# Patient Record
Sex: Female | Born: 1970
Health system: Southern US, Community
[De-identification: ages and names within clinical notes are randomized; demographics above are authoritative.]

## PROBLEM LIST (undated history)

## (undated) DIAGNOSIS — I10 Essential (primary) hypertension: Secondary | ICD-10-CM

## (undated) DIAGNOSIS — E785 Hyperlipidemia, unspecified: Secondary | ICD-10-CM

## (undated) DIAGNOSIS — K219 Gastro-esophageal reflux disease without esophagitis: Secondary | ICD-10-CM

## (undated) HISTORY — DX: Gastro-esophageal reflux disease without esophagitis: K21.9

## (undated) HISTORY — PX: TONSILLECTOMY: SUR1361

## (undated) HISTORY — PX: WISDOM TOOTH EXTRACTION: SHX21

## (undated) HISTORY — DX: Hyperlipidemia, unspecified: E78.5

## (undated) HISTORY — DX: Essential (primary) hypertension: I10

---

## 2004-01-07 ENCOUNTER — Ambulatory Visit: Payer: Self-pay | Admitting: Obstetrics and Gynecology

## 2005-08-01 ENCOUNTER — Other Ambulatory Visit: Payer: Self-pay

## 2005-08-01 ENCOUNTER — Emergency Department: Payer: Self-pay | Admitting: Emergency Medicine

## 2005-08-03 ENCOUNTER — Ambulatory Visit: Payer: Self-pay | Admitting: Emergency Medicine

## 2005-08-11 ENCOUNTER — Ambulatory Visit: Payer: Self-pay | Admitting: Gastroenterology

## 2006-03-06 ENCOUNTER — Emergency Department: Payer: Self-pay | Admitting: Unknown Physician Specialty

## 2009-08-06 ENCOUNTER — Ambulatory Visit: Payer: Self-pay | Admitting: Obstetrics and Gynecology

## 2009-08-08 ENCOUNTER — Ambulatory Visit: Payer: Self-pay | Admitting: Obstetrics and Gynecology

## 2010-09-03 ENCOUNTER — Ambulatory Visit: Payer: Self-pay | Admitting: Internal Medicine

## 2010-09-04 ENCOUNTER — Ambulatory Visit: Payer: Self-pay | Admitting: Internal Medicine

## 2011-12-25 ENCOUNTER — Emergency Department: Payer: Self-pay | Admitting: Emergency Medicine

## 2011-12-25 LAB — COMPREHENSIVE METABOLIC PANEL
Albumin: 3.7 g/dL (ref 3.4–5.0)
Alkaline Phosphatase: 122 U/L (ref 50–136)
Anion Gap: 11 (ref 7–16)
BUN: 7 mg/dL (ref 7–18)
Calcium, Total: 9 mg/dL (ref 8.5–10.1)
Chloride: 102 mmol/L (ref 98–107)
Creatinine: 0.78 mg/dL (ref 0.60–1.30)
EGFR (African American): >60
Glucose: 102 mg/dL — ABNORMAL HIGH (ref 65–99)
Osmolality: 272 (ref 275–301)
Total Protein: 7.6 g/dL (ref 6.4–8.2)

## 2011-12-25 LAB — TROPONIN I: Troponin-I: <0.02 ng/mL

## 2011-12-25 LAB — CBC
HCT: 43 % (ref 35.0–47.0)
HGB: 15.1 g/dL (ref 12.0–16.0)
WBC: 12.2 10*3/uL — ABNORMAL HIGH (ref 3.6–11.0)

## 2016-01-12 ENCOUNTER — Ambulatory Visit: Payer: Self-pay | Admitting: Podiatry

## 2016-04-06 DIAGNOSIS — Z1231 Encounter for screening mammogram for malignant neoplasm of breast: Secondary | ICD-10-CM | POA: Diagnosis not present

## 2016-04-06 DIAGNOSIS — Z01419 Encounter for gynecological examination (general) (routine) without abnormal findings: Secondary | ICD-10-CM | POA: Diagnosis not present

## 2016-04-12 DIAGNOSIS — M8589 Other specified disorders of bone density and structure, multiple sites: Secondary | ICD-10-CM | POA: Diagnosis not present

## 2016-04-12 DIAGNOSIS — Z8739 Personal history of other diseases of the musculoskeletal system and connective tissue: Secondary | ICD-10-CM | POA: Diagnosis not present

## 2016-05-12 DIAGNOSIS — H5213 Myopia, bilateral: Secondary | ICD-10-CM | POA: Diagnosis not present

## 2016-05-19 DIAGNOSIS — J069 Acute upper respiratory infection, unspecified: Secondary | ICD-10-CM | POA: Diagnosis not present

## 2016-05-19 DIAGNOSIS — J029 Acute pharyngitis, unspecified: Secondary | ICD-10-CM | POA: Diagnosis not present

## 2016-06-14 DIAGNOSIS — E559 Vitamin D deficiency, unspecified: Secondary | ICD-10-CM | POA: Diagnosis not present

## 2016-06-14 DIAGNOSIS — M8580 Other specified disorders of bone density and structure, unspecified site: Secondary | ICD-10-CM | POA: Diagnosis not present

## 2016-09-21 DIAGNOSIS — J069 Acute upper respiratory infection, unspecified: Secondary | ICD-10-CM | POA: Diagnosis not present

## 2016-11-29 DIAGNOSIS — S60862A Insect bite (nonvenomous) of left wrist, initial encounter: Secondary | ICD-10-CM | POA: Diagnosis not present

## 2016-11-29 DIAGNOSIS — S80862A Insect bite (nonvenomous), left lower leg, initial encounter: Secondary | ICD-10-CM | POA: Diagnosis not present

## 2016-11-29 DIAGNOSIS — S90861A Insect bite (nonvenomous), right foot, initial encounter: Secondary | ICD-10-CM | POA: Diagnosis not present

## 2017-02-07 DIAGNOSIS — L821 Other seborrheic keratosis: Secondary | ICD-10-CM | POA: Diagnosis not present

## 2017-02-07 DIAGNOSIS — D229 Melanocytic nevi, unspecified: Secondary | ICD-10-CM | POA: Diagnosis not present

## 2017-02-07 DIAGNOSIS — Z1283 Encounter for screening for malignant neoplasm of skin: Secondary | ICD-10-CM | POA: Diagnosis not present

## 2017-04-06 DIAGNOSIS — Z01419 Encounter for gynecological examination (general) (routine) without abnormal findings: Secondary | ICD-10-CM | POA: Diagnosis not present

## 2017-04-06 DIAGNOSIS — K219 Gastro-esophageal reflux disease without esophagitis: Secondary | ICD-10-CM | POA: Diagnosis not present

## 2017-04-06 DIAGNOSIS — Z124 Encounter for screening for malignant neoplasm of cervix: Secondary | ICD-10-CM | POA: Diagnosis not present

## 2017-04-12 DIAGNOSIS — Z1231 Encounter for screening mammogram for malignant neoplasm of breast: Secondary | ICD-10-CM | POA: Diagnosis not present

## 2017-04-16 DIAGNOSIS — M25552 Pain in left hip: Secondary | ICD-10-CM | POA: Diagnosis not present

## 2017-04-16 DIAGNOSIS — R0789 Other chest pain: Secondary | ICD-10-CM | POA: Diagnosis not present

## 2017-07-01 DIAGNOSIS — J40 Bronchitis, not specified as acute or chronic: Secondary | ICD-10-CM | POA: Diagnosis not present

## 2017-10-21 DIAGNOSIS — R232 Flushing: Secondary | ICD-10-CM | POA: Diagnosis not present

## 2017-11-08 DIAGNOSIS — N951 Menopausal and female climacteric states: Secondary | ICD-10-CM | POA: Diagnosis not present

## 2017-11-08 DIAGNOSIS — F5102 Adjustment insomnia: Secondary | ICD-10-CM | POA: Diagnosis not present

## 2018-01-16 ENCOUNTER — Ambulatory Visit: Payer: Self-pay | Admitting: Internal Medicine

## 2018-01-19 ENCOUNTER — Encounter: Payer: Self-pay | Admitting: Internal Medicine

## 2018-01-19 ENCOUNTER — Encounter (INDEPENDENT_AMBULATORY_CARE_PROVIDER_SITE_OTHER): Payer: Self-pay

## 2018-01-19 ENCOUNTER — Ambulatory Visit (INDEPENDENT_AMBULATORY_CARE_PROVIDER_SITE_OTHER): Payer: 59 | Admitting: Internal Medicine

## 2018-01-19 VITALS — BP 118/72 | HR 60 | Temp 98.0°F | Ht 63.0 in | Wt 144.0 lb

## 2018-01-19 DIAGNOSIS — K219 Gastro-esophageal reflux disease without esophagitis: Secondary | ICD-10-CM | POA: Diagnosis not present

## 2018-01-19 DIAGNOSIS — F419 Anxiety disorder, unspecified: Secondary | ICD-10-CM

## 2018-01-19 DIAGNOSIS — F5104 Psychophysiologic insomnia: Secondary | ICD-10-CM | POA: Diagnosis not present

## 2018-01-19 DIAGNOSIS — I10 Essential (primary) hypertension: Secondary | ICD-10-CM

## 2018-01-19 DIAGNOSIS — E7800 Pure hypercholesterolemia, unspecified: Secondary | ICD-10-CM | POA: Insufficient documentation

## 2018-01-19 MED ORDER — HYDROXYZINE HCL 10 MG PO TABS: 10.0000 mg | ORAL_TABLET | Freq: Every day | ORAL | 0 refills | Status: DC | PRN

## 2018-01-19 MED ORDER — SERTRALINE HCL 50 MG PO TABS: 50.0000 mg | ORAL_TABLET | Freq: Every day | ORAL | 2 refills | Status: DC

## 2018-01-19 NOTE — Assessment & Plan Note (Signed)
Support offered today Will trial Sertraline and Hydroxyzine Advised her to update me in 4 weeks

## 2018-01-19 NOTE — Assessment & Plan Note (Addendum)
Controlled on Ranitidine Encouraged her to avoid foods that trigger her reflux

## 2018-01-19 NOTE — Assessment & Plan Note (Signed)
Continue Trazadone nightly

## 2018-01-19 NOTE — Assessment & Plan Note (Signed)
Controlled on Bisoprolol-HCT Reinforced DASH diet

## 2018-01-19 NOTE — Progress Notes (Signed)
HPI  Pt presents to the clinic today to establish care and for management of the conditions listed below. She has not had a PCP in many years .  GERD: She is not sure what triggers this, maybe spicy foods. She takes Ranitidine with good relief. She has never had an upper GI.  Insomnia: Secondary to perimenopause. She has trouble staying asleep. She takes Trazadone nightly with good relief.   HTN: Her BP today is 118/72. She is taking Bisoprolol-HCT as prescribed. There is no ECG on file.  HLD: She is not sure what her last LDL was or when it was checked. She is not taking any cholesterol lowering medication but does take Fish Oil OTC. She tries to consume a low fat diet.  She also reports anxiety. She has noticed this over the last 6 months, and it seems to be getting worse. She reports she has had a lot of stress lately with her daughter getting married, switching jobs, and caring for her elderly mother. She has never had issues with anxiety or depression in the past. She denies SI/HI.  Flu: never Tetanus: unsure Pap Smear: 04/2017 Mammogram: 04/2017 Vision Screening: annually Dentist: biannually  Past Medical History:  Diagnosis Date  . GERD (gastroesophageal reflux disease)   . Hyperlipidemia   . Hypertension     Current Outpatient Medications  Medication Sig Dispense Refill  . bisoprolol-hydrochlorothiazide (ZIAC) 5-6.25 MG tablet Take 1 tablet by mouth daily.  2  . Calcium Carb-Cholecalciferol (CALTRATE 600+D3) 600-800 MG-UNIT TABS Take by mouth.    . Omega-3 Fatty Acids (FISH OIL) 1000 MG CAPS Take by mouth.    . ranitidine (ZANTAC) 150 MG tablet Take 150 mg by mouth 2 (two) times daily.  2  . traZODone (DESYREL) 50 MG tablet TAKE 1 TABLET BY MOUTH EVERY DAY AT NIGHT  4   No current facility-administered medications for this visit.     Allergies  Allergen Reactions  . Codeine Nausea Only    Family History  Problem Relation Age of Onset  . Breast cancer Mother   .  Hypertension Mother   . Anxiety disorder Mother   . Heart disease Father     Social History   Socioeconomic History  . Marital status: Married    Spouse name: Not on file  . Number of children: Not on file  . Years of education: Not on file  . Highest education level: Not on file  Occupational History  . Not on file  Social Needs  . Financial resource strain: Not on file  . Food insecurity:    Worry: Not on file    Inability: Not on file  . Transportation needs:    Medical: Not on file    Non-medical: Not on file  Tobacco Use  . Smoking status: Never Smoker  . Smokeless tobacco: Never Used  Substance and Sexual Activity  . Alcohol use: Yes    Comment: occasional  . Drug use: Never  . Sexual activity: Not on file  Lifestyle  . Physical activity:    Days per week: Not on file    Minutes per session: Not on file  . Stress: Not on file  Relationships  . Social connections:    Talks on phone: Not on file    Gets together: Not on file    Attends religious service: Not on file    Active member of club or organization: Not on file    Attends meetings of clubs or organizations: Not  on file    Relationship status: Not on file  . Intimate partner violence:    Fear of current or ex partner: Not on file    Emotionally abused: Not on file    Physically abused: Not on file    Forced sexual activity: Not on file  Other Topics Concern  . Not on file  Social History Narrative  . Not on file    ROS:  Constitutional: Denies fever, malaise, fatigue, headache or abrupt weight changes.  HEENT: Denies eye pain, eye redness, ear pain, ringing in the ears, wax buildup, runny nose, nasal congestion, bloody nose, or sore throat. Respiratory: Denies difficulty breathing, shortness of breath, cough or sputum production.   Cardiovascular: Denies chest pain, chest tightness, palpitations or swelling in the hands or feet.  Gastrointestinal: Denies abdominal pain, bloating, constipation,  diarrhea or blood in the stool.  GU: Denies frequency, urgency, pain with urination, blood in urine, odor or discharge. Musculoskeletal: Denies decrease in range of motion, difficulty with gait, muscle pain or joint pain and swelling.  Skin: Denies redness, rashes, lesions or ulcercations.  Neurological: Pt reports insomnia. Denies dizziness, difficulty with memory, difficulty with speech or problems with balance and coordination.  Psych: Pt reports anxiety. Denies depression, SI/HI.  No other specific complaints in a complete review of systems (except as listed in HPI above).  PE:  BP 118/72   Pulse 60   Temp 98 F (36.7 C) (Oral)   Ht 5\' 3"  (1.6 m)   Wt 144 lb (65.3 kg)   SpO2 98%   BMI 25.51 kg/m   Wt Readings from Last 3 Encounters:  01/19/18 144 lb (65.3 kg)    General: Appears her stated age, well developed, well nourished in NAD. Cardiovascular: Normal rate and rhythm. S1,S2 noted.  No murmur, rubs or gallops noted. No JVD or BLE edema.  Pulmonary/Chest: Normal effort and positive vesicular breath sounds. No respiratory distress. No wheezes, rales or ronchi noted.  Abdomen: Soft and nontender. Normal bowel sounds. Neurological: Alert and oriented.  Psychiatric: She is mildly anxious appearing. Behavior is normal. Judgment and thought content normal.    BMET    Component Value Date/Time   NA 137 12/25/2011 2114   K 3.5 12/25/2011 2114   CL 102 12/25/2011 2114   CO2 24 12/25/2011 2114   GLUCOSE 102 (H) 12/25/2011 2114   BUN 7 12/25/2011 2114   CREATININE 0.78 12/25/2011 2114   CALCIUM 9.0 12/25/2011 2114   GFRNONAA >60 12/25/2011 2114   GFRAA >60 12/25/2011 2114    Lipid Panel  No results found for: CHOL, TRIG, HDL, CHOLHDL, VLDL, LDLCALC  CBC    Component Value Date/Time   WBC 12.2 (H) 12/25/2011 2114   RBC 4.87 12/25/2011 2114   HGB 15.1 12/25/2011 2114   HCT 43.0 12/25/2011 2114   PLT 207 12/25/2011 2114   MCV 88 12/25/2011 2114   MCH 30.9  12/25/2011 2114   MCHC 35.0 12/25/2011 2114   RDW 12.9 12/25/2011 2114    Hgb A1C No results found for: HGBA1C   Assessment and Plan:

## 2018-01-19 NOTE — Assessment & Plan Note (Signed)
Encouraged her to consume a low fat diet Continue Fish Oil OTC

## 2018-01-19 NOTE — Patient Instructions (Signed)

## 2018-01-22 DIAGNOSIS — S90561A Insect bite (nonvenomous), right ankle, initial encounter: Secondary | ICD-10-CM | POA: Diagnosis not present

## 2018-01-22 DIAGNOSIS — Z23 Encounter for immunization: Secondary | ICD-10-CM | POA: Diagnosis not present

## 2018-01-22 DIAGNOSIS — R238 Other skin changes: Secondary | ICD-10-CM | POA: Diagnosis not present

## 2018-02-11 ENCOUNTER — Other Ambulatory Visit: Payer: Self-pay | Admitting: Internal Medicine

## 2018-03-11 ENCOUNTER — Other Ambulatory Visit: Payer: Self-pay | Admitting: Internal Medicine

## 2018-03-14 MED ORDER — HYDROXYZINE HCL 10 MG PO TABS: 10.0000 mg | ORAL_TABLET | Freq: Every day | ORAL | 0 refills | Status: DC | PRN

## 2018-03-30 ENCOUNTER — Other Ambulatory Visit: Payer: Self-pay | Admitting: Internal Medicine

## 2018-03-31 MED ORDER — SERTRALINE HCL 50 MG PO TABS: 50.0000 mg | ORAL_TABLET | Freq: Every day | ORAL | 0 refills | Status: DC

## 2018-03-31 NOTE — Addendum Note (Signed)
Addended by: Lurlean Nanny on: 03/31/2018 03:29 PM   Modules accepted: Orders

## 2018-04-05 ENCOUNTER — Other Ambulatory Visit: Payer: Self-pay | Admitting: Internal Medicine

## 2018-04-19 DIAGNOSIS — Z124 Encounter for screening for malignant neoplasm of cervix: Secondary | ICD-10-CM | POA: Diagnosis not present

## 2018-04-19 DIAGNOSIS — N941 Unspecified dyspareunia: Secondary | ICD-10-CM | POA: Diagnosis not present

## 2018-04-19 DIAGNOSIS — Z01419 Encounter for gynecological examination (general) (routine) without abnormal findings: Secondary | ICD-10-CM | POA: Diagnosis not present

## 2018-04-19 DIAGNOSIS — Z1211 Encounter for screening for malignant neoplasm of colon: Secondary | ICD-10-CM | POA: Diagnosis not present

## 2018-04-20 DIAGNOSIS — Z01419 Encounter for gynecological examination (general) (routine) without abnormal findings: Secondary | ICD-10-CM | POA: Diagnosis not present

## 2018-04-28 DIAGNOSIS — Z1231 Encounter for screening mammogram for malignant neoplasm of breast: Secondary | ICD-10-CM | POA: Diagnosis not present

## 2018-05-09 ENCOUNTER — Other Ambulatory Visit: Payer: Self-pay | Admitting: Internal Medicine

## 2018-05-10 DIAGNOSIS — R87612 Low grade squamous intraepithelial lesion on cytologic smear of cervix (LGSIL): Secondary | ICD-10-CM | POA: Diagnosis not present

## 2018-05-10 DIAGNOSIS — N72 Inflammatory disease of cervix uteri: Secondary | ICD-10-CM | POA: Diagnosis not present

## 2018-07-05 ENCOUNTER — Other Ambulatory Visit: Payer: Self-pay | Admitting: Internal Medicine

## 2018-07-06 NOTE — Telephone Encounter (Signed)
Need to schedule doxy.me video visit

## 2018-07-10 NOTE — Telephone Encounter (Signed)
appt scheduled

## 2018-07-11 ENCOUNTER — Ambulatory Visit (INDEPENDENT_AMBULATORY_CARE_PROVIDER_SITE_OTHER): Payer: 59 | Admitting: Internal Medicine

## 2018-07-11 ENCOUNTER — Other Ambulatory Visit: Payer: Self-pay

## 2018-07-11 ENCOUNTER — Encounter: Payer: Self-pay | Admitting: Internal Medicine

## 2018-07-11 DIAGNOSIS — F419 Anxiety disorder, unspecified: Secondary | ICD-10-CM

## 2018-07-11 DIAGNOSIS — E7800 Pure hypercholesterolemia, unspecified: Secondary | ICD-10-CM | POA: Diagnosis not present

## 2018-07-11 DIAGNOSIS — K219 Gastro-esophageal reflux disease without esophagitis: Secondary | ICD-10-CM | POA: Diagnosis not present

## 2018-07-11 DIAGNOSIS — I10 Essential (primary) hypertension: Secondary | ICD-10-CM

## 2018-07-11 DIAGNOSIS — F5104 Psychophysiologic insomnia: Secondary | ICD-10-CM

## 2018-07-11 MED ORDER — TRAZODONE HCL 50 MG PO TABS: 100.0000 mg | ORAL_TABLET | Freq: Every evening | ORAL | 2 refills | Status: DC | PRN

## 2018-07-11 NOTE — Assessment & Plan Note (Signed)
CMET and Lipid profile abstracted Encouraged her to consume a low fat diet

## 2018-07-11 NOTE — Assessment & Plan Note (Signed)
Continue Ranitidine for now Encouraged her to avoid foods that trigger reflux CBC and CMET abstracted into the chart

## 2018-07-11 NOTE — Progress Notes (Signed)
Virtual Visit via Video Note  I connected with Oris Drone on 07/11/18 at 12:00 PM EDT by a video enabled telemedicine application and verified that I am speaking with the correct person using two identifiers.   I discussed the limitations of evaluation and management by telemedicine and the availability of in person appointments. The patient expressed understanding and agreed to proceed.  History of Present Illness:  Pt due for follow up of chronic medical conditions.   Anxiety: Was started on Sertraline 01/2018. She has been taking the medication as prescribed and denies adverse side effects. She uses Hydroxyzine as needed with good relief and takes Trazadone at night to sleep.    HTN: She is taking Bisoprolol HCT as prescribed. She does not monitor her BP. She denies headaches's, dizziness, chest pain or SOB. ECG from 12/2011 reviewed.  HLD: There is no lipid panel on file. She does consume a low fat diet.  GERD: She denies breakthrough on Ranitidine. There is no upper GI on file.  Observations/Objective:  Alert and oriented, NAD Judgement and thought content normal.  Assessment and Plan:  See problem based charting  Follow Up Instructions:    I discussed the assessment and treatment plan with the patient. The patient was provided an opportunity to ask questions and all were answered. The patient agreed with the plan and demonstrated an understanding of the instructions.   The patient was advised to call back or seek an in-person evaluation if the symptoms worsen or if the condition fails to improve as anticipated.    Webb Silversmith, NP

## 2018-07-11 NOTE — Assessment & Plan Note (Signed)
Continue Bisoprolol HCTZ Reinforced DASH diet and exercise for weight loss CMET abstracted into chart

## 2018-07-11 NOTE — Patient Instructions (Signed)
Fat and Cholesterol Restricted Eating Plan Getting too much fat and cholesterol in your diet may cause health problems. Choosing the right foods helps keep your fat and cholesterol at normal levels. This can keep you from getting certain diseases. Your doctor may recommend an eating plan that includes:  Total fat: ______% or less of total calories a day.  Saturated fat: ______% or less of total calories a day.  Cholesterol: less than _________mg a day.  Fiber: ______g a day. What are tips for following this plan? Meal planning  At meals, divide your plate into four equal parts: ? Fill one-half of your plate with vegetables and green salads. ? Fill one-fourth of your plate with whole grains. ? Fill one-fourth of your plate with low-fat (lean) protein foods.  Eat fish that is high in omega-3 fats at least two times a week. This includes mackerel, tuna, sardines, and salmon.  Eat foods that are high in fiber, such as whole grains, beans, apples, broccoli, carrots, peas, and barley. General tips   Work with your doctor to lose weight if you need to.  Avoid: ? Foods with added sugar. ? Fried foods. ? Foods with partially hydrogenated oils.  Limit alcohol intake to no more than 1 drink a day for nonpregnant women and 2 drinks a day for men. One drink equals 12 oz of beer, 5 oz of wine, or 1 oz of hard liquor. Reading food labels  Check food labels for: ? Trans fats. ? Partially hydrogenated oils. ? Saturated fat (g) in each serving. ? Cholesterol (mg) in each serving. ? Fiber (g) in each serving.  Choose foods with healthy fats, such as: ? Monounsaturated fats. ? Polyunsaturated fats. ? Omega-3 fats.  Choose grain products that have whole grains. Look for the word "whole" as the first word in the ingredient list. Cooking  Cook foods using low-fat methods. These include baking, boiling, grilling, and broiling.  Eat more home-cooked foods. Eat at restaurants and buffets  less often.  Avoid cooking using saturated fats, such as butter, cream, palm oil, palm kernel oil, and coconut oil. Recommended foods  Fruits  All fresh, canned (in natural juice), or frozen fruits. Vegetables  Fresh or frozen vegetables (raw, steamed, roasted, or grilled). Green salads. Grains  Whole grains, such as whole wheat or whole grain breads, crackers, cereals, and pasta. Unsweetened oatmeal, bulgur, barley, quinoa, or brown rice. Corn or whole wheat flour tortillas. Meats and other protein foods  Ground beef (85% or leaner), grass-fed beef, or beef trimmed of fat. Skinless chicken or turkey. Ground chicken or turkey. Pork trimmed of fat. All fish and seafood. Egg whites. Dried beans, peas, or lentils. Unsalted nuts or seeds. Unsalted canned beans. Nut butters without added sugar or oil. Dairy  Low-fat or nonfat dairy products, such as skim or 1% milk, 2% or reduced-fat cheeses, low-fat and fat-free ricotta or cottage cheese, or plain low-fat and nonfat yogurt. Fats and oils  Tub margarine without trans fats. Light or reduced-fat mayonnaise and salad dressings. Avocado. Olive, canola, sesame, or safflower oils. The items listed above may not be a complete list of foods and beverages you can eat. Contact a dietitian for more information. Foods to avoid Fruits  Canned fruit in heavy syrup. Fruit in cream or butter sauce. Fried fruit. Vegetables  Vegetables cooked in cheese, cream, or butter sauce. Fried vegetables. Grains  White bread. White pasta. White rice. Cornbread. Bagels, pastries, and croissants. Crackers and snack foods that contain trans fat   and hydrogenated oils. Meats and other protein foods  Fatty cuts of meat. Ribs, chicken wings, bacon, sausage, bologna, salami, chitterlings, fatback, hot dogs, bratwurst, and packaged lunch meats. Liver and organ meats. Whole eggs and egg yolks. Chicken and turkey with skin. Fried meat. Dairy  Whole or 2% milk, cream,  half-and-half, and cream cheese. Whole milk cheeses. Whole-fat or sweetened yogurt. Full-fat cheeses. Nondairy creamers and whipped toppings. Processed cheese, cheese spreads, and cheese curds. Beverages  Alcohol. Sugar-sweetened drinks such as sodas, lemonade, and fruit drinks. Fats and oils  Butter, stick margarine, lard, shortening, ghee, or bacon fat. Coconut, palm kernel, and palm oils. Sweets and desserts  Corn syrup, sugars, honey, and molasses. Candy. Jam and jelly. Syrup. Sweetened cereals. Cookies, pies, cakes, donuts, muffins, and ice cream. The items listed above may not be a complete list of foods and beverages you should avoid. Contact a dietitian for more information. Summary  Choosing the right foods helps keep your fat and cholesterol at normal levels. This can keep you from getting certain diseases.  At meals, fill one-half of your plate with vegetables and green salads.  Eat high-fiber foods, like whole grains, beans, apples, carrots, peas, and barley.  Limit added sugar, saturated fats, alcohol, and fried foods. This information is not intended to replace advice given to you by your health care provider. Make sure you discuss any questions you have with your health care provider. Document Released: 09/21/2011 Document Revised: 11/23/2017 Document Reviewed: 12/07/2016 Elsevier Interactive Patient Education  2019 Elsevier Inc.   

## 2018-07-11 NOTE — Assessment & Plan Note (Signed)
Deteriorated Will increase Trazadone to 100 mg QHS, RX sent to pharmacy

## 2018-07-11 NOTE — Assessment & Plan Note (Signed)
Stable on Sertraline and Hydroxyzine Support offered today Will monitor

## 2018-08-15 ENCOUNTER — Other Ambulatory Visit: Payer: Self-pay | Admitting: Internal Medicine

## 2018-09-06 ENCOUNTER — Other Ambulatory Visit: Payer: Self-pay

## 2018-09-06 NOTE — Telephone Encounter (Signed)
Pt left v/m requesting refill sertraline; pt said had virtual visit on 07/11/18.Please advise.

## 2018-09-08 MED ORDER — SERTRALINE HCL 50 MG PO TABS: 50.0000 mg | ORAL_TABLET | Freq: Every day | ORAL | 1 refills | Status: DC

## 2018-10-10 DIAGNOSIS — Z1211 Encounter for screening for malignant neoplasm of colon: Secondary | ICD-10-CM | POA: Diagnosis not present

## 2018-10-10 DIAGNOSIS — Z01812 Encounter for preprocedural laboratory examination: Secondary | ICD-10-CM | POA: Diagnosis not present

## 2018-10-10 DIAGNOSIS — K219 Gastro-esophageal reflux disease without esophagitis: Secondary | ICD-10-CM | POA: Diagnosis not present

## 2018-11-12 ENCOUNTER — Telehealth: Payer: Self-pay

## 2018-11-12 DIAGNOSIS — Z20822 Contact with and (suspected) exposure to covid-19: Secondary | ICD-10-CM

## 2018-11-12 NOTE — Telephone Encounter (Signed)
Pt called wanting Covid screening. Order placed in computer. Pt going to [employer].

## 2018-11-13 ENCOUNTER — Other Ambulatory Visit: Payer: Self-pay

## 2018-11-13 DIAGNOSIS — Z20822 Contact with and (suspected) exposure to covid-19: Secondary | ICD-10-CM

## 2018-11-14 LAB — NOVEL CORONAVIRUS, NAA: SARS-CoV-2, NAA: NOT DETECTED

## 2019-01-13 ENCOUNTER — Other Ambulatory Visit: Payer: Self-pay | Admitting: Internal Medicine

## 2019-01-19 ENCOUNTER — Other Ambulatory Visit: Payer: Self-pay

## 2019-01-19 DIAGNOSIS — Z20822 Contact with and (suspected) exposure to covid-19: Secondary | ICD-10-CM

## 2019-01-21 LAB — NOVEL CORONAVIRUS, NAA: SARS-CoV-2, NAA: NOT DETECTED

## 2019-01-29 ENCOUNTER — Other Ambulatory Visit: Payer: Self-pay | Admitting: Internal Medicine

## 2019-01-31 ENCOUNTER — Telehealth: Payer: Self-pay | Admitting: Internal Medicine

## 2019-01-31 NOTE — Telephone Encounter (Signed)
Patient called.  Patient said she had cpx with Rollene Fare and they discussed anxiety.  Patient feels she needs to have her medication adjusted.  Patient said she has a lot of stress.  Patient said she's flying to [place] next Friday.  Patient said the last time she was suppose to fly, she wasn't able to get on the plane.  Patient wants to know if she can have a medication called in to help her calm down for the flight.  Patient uses [facility].

## 2019-02-01 NOTE — Telephone Encounter (Signed)
Have her increase Sertraline to 75 mg- please change on MAR to reflect this. She has hydroxyzine that she can take 30 minutes prior to getting on the plane.

## 2019-02-06 ENCOUNTER — Other Ambulatory Visit: Payer: Self-pay | Admitting: Internal Medicine

## 2019-02-06 MED ORDER — ALPRAZOLAM 0.25 MG PO TABS: 0.2500 mg | ORAL_TABLET | Freq: Every day | ORAL | 0 refills | Status: DC | PRN

## 2019-02-06 NOTE — Telephone Encounter (Signed)
I will give a small quantity of Xanax 0.25. She can try switching Zoloft to daytime, it may help.

## 2019-02-06 NOTE — Telephone Encounter (Signed)
Pt said she has been taking hydroxyzine daily and does not think that will help pt before flying. Pt said she pops BC and they seem to help some. Pt did not get a call back about the instructions from 02/01/19. Pt has been taking sertraline 50 mg for about 1 yr and is not sure that will help to increase that med. Pt takes sertraline 50 mg at night and pt wants to know if taking in the daytime might be more effective. Pt said if she needs to have a virtual visit she will try but prefers a phone call back with Avie Echevaria NP suggestion. [facility] [place].

## 2019-02-07 NOTE — Telephone Encounter (Signed)
Pt left v/m requesting cb. 

## 2019-02-07 NOTE — Telephone Encounter (Signed)
Patient called back.  She was advised of message below.  She stated she understood and would call us back if this does not help

## 2019-03-06 DIAGNOSIS — J069 Acute upper respiratory infection, unspecified: Secondary | ICD-10-CM | POA: Diagnosis not present

## 2019-03-06 DIAGNOSIS — Z03818 Encounter for observation for suspected exposure to other biological agents ruled out: Secondary | ICD-10-CM | POA: Diagnosis not present

## 2019-03-06 DIAGNOSIS — J029 Acute pharyngitis, unspecified: Secondary | ICD-10-CM | POA: Diagnosis not present

## 2019-04-19 DIAGNOSIS — Z01812 Encounter for preprocedural laboratory examination: Secondary | ICD-10-CM | POA: Diagnosis not present

## 2019-04-20 ENCOUNTER — Other Ambulatory Visit: Payer: Self-pay | Admitting: Internal Medicine

## 2019-04-20 DIAGNOSIS — Z1211 Encounter for screening for malignant neoplasm of colon: Secondary | ICD-10-CM | POA: Diagnosis not present

## 2019-04-20 DIAGNOSIS — K449 Diaphragmatic hernia without obstruction or gangrene: Secondary | ICD-10-CM | POA: Diagnosis not present

## 2019-04-20 DIAGNOSIS — K222 Esophageal obstruction: Secondary | ICD-10-CM | POA: Diagnosis not present

## 2019-04-20 DIAGNOSIS — K2970 Gastritis, unspecified, without bleeding: Secondary | ICD-10-CM | POA: Diagnosis not present

## 2019-04-20 DIAGNOSIS — K219 Gastro-esophageal reflux disease without esophagitis: Secondary | ICD-10-CM | POA: Diagnosis not present

## 2019-04-20 DIAGNOSIS — R1310 Dysphagia, unspecified: Secondary | ICD-10-CM | POA: Diagnosis not present

## 2019-04-30 DIAGNOSIS — Z1231 Encounter for screening mammogram for malignant neoplasm of breast: Secondary | ICD-10-CM | POA: Diagnosis not present

## 2019-04-30 DIAGNOSIS — Z23 Encounter for immunization: Secondary | ICD-10-CM | POA: Diagnosis not present

## 2019-04-30 DIAGNOSIS — Z124 Encounter for screening for malignant neoplasm of cervix: Secondary | ICD-10-CM | POA: Diagnosis not present

## 2019-04-30 DIAGNOSIS — Z01419 Encounter for gynecological examination (general) (routine) without abnormal findings: Secondary | ICD-10-CM | POA: Diagnosis not present

## 2019-05-02 DIAGNOSIS — Z01419 Encounter for gynecological examination (general) (routine) without abnormal findings: Secondary | ICD-10-CM | POA: Diagnosis not present

## 2019-05-02 DIAGNOSIS — Z13220 Encounter for screening for lipoid disorders: Secondary | ICD-10-CM | POA: Diagnosis not present

## 2019-05-08 DIAGNOSIS — Z1231 Encounter for screening mammogram for malignant neoplasm of breast: Secondary | ICD-10-CM | POA: Diagnosis not present

## 2019-05-24 ENCOUNTER — Other Ambulatory Visit: Payer: Self-pay | Admitting: Internal Medicine

## 2019-06-21 ENCOUNTER — Other Ambulatory Visit: Payer: Self-pay | Admitting: Internal Medicine

## 2019-06-25 ENCOUNTER — Encounter: Payer: Self-pay | Admitting: Internal Medicine

## 2019-06-25 ENCOUNTER — Ambulatory Visit (INDEPENDENT_AMBULATORY_CARE_PROVIDER_SITE_OTHER): Payer: BC Managed Care – PPO | Admitting: Internal Medicine

## 2019-06-25 ENCOUNTER — Other Ambulatory Visit: Payer: Self-pay

## 2019-06-25 VITALS — BP 128/82 | HR 77 | Temp 97.9°F | Wt 171.0 lb

## 2019-06-25 DIAGNOSIS — R635 Abnormal weight gain: Secondary | ICD-10-CM

## 2019-06-25 DIAGNOSIS — F419 Anxiety disorder, unspecified: Secondary | ICD-10-CM

## 2019-06-25 MED ORDER — SERTRALINE HCL 100 MG PO TABS: 100.0000 mg | ORAL_TABLET | Freq: Every day | ORAL | 1 refills | Status: DC

## 2019-06-25 MED ORDER — ALPRAZOLAM 0.25 MG PO TABS: 0.2500 mg | ORAL_TABLET | Freq: Every day | ORAL | 0 refills | Status: DC | PRN

## 2019-06-25 NOTE — Progress Notes (Signed)
Subjective:    Patient ID: Kristen Macdonald, female    DOB: 02/28/1971, 49 y.o.   MRN: [id number]  HPI  Pt presents to the clinic today to follow up anxiety. She reports increased stress at work and stress from being a caregiver of her 63 year old mother. She reports muscle tension in her neck and shoulders. She is currently managed on Sertraline and Hydroxyzine. She takes the Hydroxyzine daily for anxiety, but does not feel like it is effective. She is not currently seeing a therapist. She denies depression, SI/HI.  She is also concerned about weight gain. She reports 3-4 years ago she was around 135 lbs. She feels like she stress eats. She has lost weight in the past with Keto, but was unable to maintain it.  She tells me that she is going to [facility] in May, and will need Xanax 0.25 mg prior to flying.  Review of Systems      Past Medical History:  Diagnosis Date  . GERD (gastroesophageal reflux disease)   . Hyperlipidemia   . Hypertension     Current Outpatient Medications  Medication Sig Dispense Refill  . ALPRAZolam (XANAX) 0.25 MG tablet Take 1 tablet (0.25 mg total) by mouth daily as needed for anxiety. 10 tablet 0  . bisoprolol-hydrochlorothiazide (ZIAC) 5-6.25 MG tablet Take 1 tablet by mouth daily.  2  . Calcium Carb-Cholecalciferol (CALTRATE 600+D3) 600-800 MG-UNIT TABS Take by mouth.    . hydrOXYzine (ATARAX/VISTARIL) 10 MG tablet TAKE 1 TABLET BY MOUTH EVERY DAY AS NEEDED (MUST SCHEDULE ANNUAL EXAM) 30 tablet 0  . Omega-3 Fatty Acids (FISH OIL) 1000 MG CAPS Take by mouth.    . sertraline (ZOLOFT) 50 MG tablet Take 1 tablet (50 mg total) by mouth daily. MUST SCHEDULE ANNUAL PHYSICAL 30 tablet 0  . traZODone (DESYREL) 50 MG tablet TAKE 2 TABLETS (100 MG TOTAL) BY MOUTH AT BEDTIME AS NEEDED FOR SLEEP. 180 tablet 1   No current facility-administered medications for this visit.    Allergies  Allergen Reactions  . Codeine Nausea Only    Family History  Problem Relation  Age of Onset  . Breast cancer Mother   . Hypertension Mother   . Anxiety disorder Mother   . Heart disease Father     Social History   Socioeconomic History  . Marital status: Married    Spouse name: Not on file  . Number of children: Not on file  . Years of education: Not on file  . Highest education level: Not on file  Occupational History  . Not on file  Tobacco Use  . Smoking status: Never Smoker  . Smokeless tobacco: Never Used  Substance and Sexual Activity  . Alcohol use: Yes    Comment: occasional  . Drug use: Never  . Sexual activity: Not on file  Other Topics Concern  . Not on file  Social History Narrative  . Not on file   Social Determinants of Health   Financial Resource Strain:   . Difficulty of Paying Living Expenses:   Food Insecurity:   . Worried About Charity fundraiser in the Last Year:   . Arboriculturist in the Last Year:   Transportation Needs:   . Film/video editor (Medical):   Marland Kitchen Lack of Transportation (Non-Medical):   Physical Activity:   . Days of Exercise per Week:   . Minutes of Exercise per Session:   Stress:   . Feeling of Stress :  Social Connections:   . Frequency of Communication with Friends and Family:   . Frequency of Social Gatherings with Friends and Family:   . Attends Religious Services:   . Active Member of Clubs or Organizations:   . Attends Archivist Meetings:   Marland Kitchen Marital Status:   Intimate Partner Violence:   . Fear of Current or Ex-Partner:   . Emotionally Abused:   Marland Kitchen Physically Abused:   . Sexually Abused:      Constitutional: Pt reports weight gain. Denies fever, malaise, fatigue, headache.  Respiratory: Denies difficulty breathing, shortness of breath, cough or sputum production.   Cardiovascular: Denies chest pain, chest tightness, palpitations or swelling in the hands or feet.  Neurological: Denies dizziness, difficulty with memory, difficulty with speech or problems with balance and  coordination.  Psych: Pt reports anxiety. Denies depression, SI/HI.  No other specific complaints in a complete review of systems (except as listed in HPI above).  Objective:   Physical Exam  BP 128/82   Pulse 77   Temp 97.9 F (36.6 C) (Temporal)   Wt 171 lb (77.6 kg)   SpO2 98%   BMI 30.29 kg/m   Wt Readings from Last 3 Encounters:  01/19/18 144 lb (65.3 kg)    General: Appears her stated age, obese, in NAD. Neurological: Alert and oriented.  Psychiatric: Mildly anxious appearing. Behavior is normal. Judgment and thought content normal.    BMET    Component Value Date/Time   NA 137 12/25/2011 2114   K 3.5 12/25/2011 2114   CL 102 12/25/2011 2114   CO2 24 12/25/2011 2114   GLUCOSE 102 (H) 12/25/2011 2114   BUN 7 12/25/2011 2114   CREATININE 0.78 12/25/2011 2114   CALCIUM 9.0 12/25/2011 2114   GFRNONAA >60 12/25/2011 2114   GFRAA >60 12/25/2011 2114    Lipid Panel  No results found for: CHOL, TRIG, HDL, CHOLHDL, VLDL, LDLCALC  CBC    Component Value Date/Time   WBC 12.2 (H) 12/25/2011 2114   RBC 4.87 12/25/2011 2114   HGB 15.1 12/25/2011 2114   HCT 43.0 12/25/2011 2114   PLT 207 12/25/2011 2114   MCV 88 12/25/2011 2114   MCH 30.9 12/25/2011 2114   MCHC 35.0 12/25/2011 2114   RDW 12.9 12/25/2011 2114    Hgb A1C No results found for: HGBA1C          Assessment & Plan:   Abnormal Weight Gain:  Unable to use weight loss medication due to risk of Serotonin syndrome Encouraged her to try keto again but come up with a transition plan in advance  Update me in 3-4 weeks via mychart and let me know how you are doing Webb Silversmith, NP This visit occurred during the SARS-CoV-2 public health emergency.  Safety protocols were in place, including screening questions prior to the visit, additional usage of staff PPE, and extensive cleaning of exam room while observing appropriate contact time as indicated for disinfecting solutions.

## 2019-06-25 NOTE — Assessment & Plan Note (Signed)
Increase Sertraline to 100 mg daily Continue Hydroxyzine as needed Xanax refilled- sedation caution given

## 2019-06-25 NOTE — Patient Instructions (Signed)

## 2019-07-09 ENCOUNTER — Telehealth: Payer: Self-pay | Admitting: Internal Medicine

## 2019-07-09 NOTE — Telephone Encounter (Signed)
Spoke to patient and she is aware that Webb Silversmith NP is out of the office this week.Patient stated that the last time she was in the office her sertraline was doubled and she has been doing that since last visit.  Advised patient that we will be back in touch with her as soon as we get a response and she verbalized understanding.

## 2019-07-09 NOTE — Telephone Encounter (Signed)
Pt called stating that she was in a couple weeks ago and Rollene Fare double meds of sertraline.  This is helping a little bit but not much and wanted to know if there was something else pt could try.  [facility]

## 2019-07-09 NOTE — Telephone Encounter (Signed)
If anxiety, would recommend buspar 5 mg tid. Let me know if she is agreeable.

## 2019-07-10 MED ORDER — BUSPIRONE HCL 5 MG PO TABS: 5.0000 mg | ORAL_TABLET | Freq: Three times a day (TID) | ORAL | 2 refills | Status: DC | PRN

## 2019-07-10 NOTE — Telephone Encounter (Signed)
Patient advised. Patient would like to try Buspar. [facility] [place]. Patient was asking if she takes Buspar does she need to stop Sertraline? Wanted to make sure. Patient is aware that I was not sure when Rollene Fare would be able to address this but she would hear back when she does.

## 2019-07-10 NOTE — Addendum Note (Signed)
Addended by: Jearld Fenton on: 07/10/2019 02:35 PM   Modules accepted: Orders

## 2019-07-10 NOTE — Telephone Encounter (Signed)
Continue Sertraline. Add Buspar in addition. RX sent to pharmacy.

## 2019-07-11 NOTE — Telephone Encounter (Signed)
Pt is aware as instructed 

## 2019-07-21 ENCOUNTER — Other Ambulatory Visit: Payer: Self-pay | Admitting: Internal Medicine

## 2019-07-23 NOTE — Telephone Encounter (Signed)
Last filled 06/21/2019... pt has appt scheduled for CPE

## 2019-07-31 ENCOUNTER — Other Ambulatory Visit: Payer: Self-pay

## 2019-07-31 ENCOUNTER — Ambulatory Visit (INDEPENDENT_AMBULATORY_CARE_PROVIDER_SITE_OTHER): Payer: BC Managed Care – PPO | Admitting: Internal Medicine

## 2019-07-31 ENCOUNTER — Encounter: Payer: Self-pay | Admitting: Internal Medicine

## 2019-07-31 VITALS — BP 126/74 | HR 78 | Temp 97.0°F | Ht 63.25 in | Wt 163.0 lb

## 2019-07-31 DIAGNOSIS — Z0000 Encounter for general adult medical examination without abnormal findings: Secondary | ICD-10-CM

## 2019-07-31 DIAGNOSIS — E7800 Pure hypercholesterolemia, unspecified: Secondary | ICD-10-CM

## 2019-07-31 DIAGNOSIS — K219 Gastro-esophageal reflux disease without esophagitis: Secondary | ICD-10-CM

## 2019-07-31 DIAGNOSIS — I10 Essential (primary) hypertension: Secondary | ICD-10-CM

## 2019-07-31 DIAGNOSIS — F419 Anxiety disorder, unspecified: Secondary | ICD-10-CM

## 2019-07-31 DIAGNOSIS — F5104 Psychophysiologic insomnia: Secondary | ICD-10-CM | POA: Diagnosis not present

## 2019-07-31 NOTE — Assessment & Plan Note (Signed)
She will call me with the name of the medication she is taking

## 2019-07-31 NOTE — Progress Notes (Signed)
Subjective:    Patient ID: ARRYN Macdonald, female    DOB: 1970/04/17, 49 y.o.   MRN: [id number]  HPI  Patient presents to the clinic today for her annual exam.  She is also due to follow-up chronic conditions.  Anxiety: Managed with Sertraline, BuSpar, Hydroxyzine. She takes Xanax prior to flying only.  She is not currently seeing a therapist.  She denies depression, SI/HI.  HTN: Her BP today is 126/74.  She is taking Bisoprolol HCT as prescribed.  ECG from 12/2011 reviewed.  GERD: Triggered by ETOH, chick fila. She denies breakthrough on a med, unsure of the name prescribed by Dr. Rossie Macdonald There is no upper GI on file.  Insomnia: Currently not an issue when she takes Trazodone as needed with good relief of symptoms.  There is no sleep study on file.  Flu: never Tetanus: 01/2018 Covid: 03/2019 Pap smear: 05/2019 Mammogram: 05/2019 Colon screening: 03/2019 Vision screening: annually Dentist: biannually  Diet: She does eat meat. She does eat veggies, no fruits. She tries to avoid fried foods. She drinks mostly water. Exercise: None  Review of Systems  Past Medical History:  Diagnosis Date  . GERD (gastroesophageal reflux disease)   . Hyperlipidemia   . Hypertension     Current Outpatient Medications  Medication Sig Dispense Refill  . ALPRAZolam (XANAX) 0.25 MG tablet Take 1 tablet (0.25 mg total) by mouth daily as needed for anxiety. 10 tablet 0  . bisoprolol-hydrochlorothiazide (ZIAC) 5-6.25 MG tablet Take 1 tablet by mouth daily.  2  . busPIRone (BUSPAR) 5 MG tablet Take 1 tablet (5 mg total) by mouth 3 (three) times daily as needed. 90 tablet 2  . Calcium Carb-Cholecalciferol (CALTRATE 600+D3) 600-800 MG-UNIT TABS Take by mouth.    . hydrOXYzine (ATARAX/VISTARIL) 10 MG tablet Take 1 tablet (10 mg total) by mouth daily as needed. 30 tablet 0  . Omega-3 Fatty Acids (FISH OIL) 1000 MG CAPS Take by mouth.    . sertraline (ZOLOFT) 100 MG tablet Take 1 tablet (100 mg total)  by mouth daily. 90 tablet 1  . traZODone (DESYREL) 50 MG tablet TAKE 2 TABLETS (100 MG TOTAL) BY MOUTH AT BEDTIME AS NEEDED FOR SLEEP. (Patient taking differently: Take 25 mg by mouth at bedtime as needed for sleep. ) 180 tablet 1   No current facility-administered medications for this visit.    Allergies  Allergen Reactions  . Codeine Nausea Only    Family History  Problem Relation Age of Onset  . Breast cancer Mother   . Hypertension Mother   . Anxiety disorder Mother   . Heart disease Father     Social History   Socioeconomic History  . Marital status: Married    Spouse name: Not on file  . Number of children: Not on file  . Years of education: Not on file  . Highest education level: Not on file  Occupational History  . Not on file  Tobacco Use  . Smoking status: Never Smoker  . Smokeless tobacco: Never Used  Substance and Sexual Activity  . Alcohol use: Yes    Comment: occasional  . Drug use: Never  . Sexual activity: Not on file  Other Topics Concern  . Not on file  Social History Narrative  . Not on file   Social Determinants of Health   Financial Resource Strain:   . Difficulty of Paying Living Expenses:   Food Insecurity:   . Worried About Charity fundraiser in the Last  Year:   . Ran Out of Food in the Last Year:   Transportation Needs:   . Film/video editor (Medical):   Marland Kitchen Lack of Transportation (Non-Medical):   Physical Activity:   . Days of Exercise per Week:   . Minutes of Exercise per Session:   Stress:   . Feeling of Stress :   Social Connections:   . Frequency of Communication with Friends and Family:   . Frequency of Social Gatherings with Friends and Family:   . Attends Religious Services:   . Active Member of Clubs or Organizations:   . Attends Archivist Meetings:   Marland Kitchen Marital Status:   Intimate Partner Violence:   . Fear of Current or Ex-Partner:   . Emotionally Abused:   Marland Kitchen Physically Abused:   . Sexually Abused:        Constitutional: Denies fever, malaise, fatigue, headache or abrupt weight changes.  HEENT: Denies eye pain, eye redness, ear pain, ringing in the ears, wax buildup, runny nose, nasal congestion, bloody nose, or sore throat. Respiratory: Denies difficulty breathing, shortness of breath, cough or sputum production.   Cardiovascular: Denies chest pain, chest tightness, palpitations or swelling in the hands or feet.  Gastrointestinal: Denies abdominal pain, bloating, constipation, diarrhea or blood in the stool.  GU: Denies urgency, frequency, pain with urination, burning sensation, blood in urine, odor or discharge. Musculoskeletal: Denies decrease in range of motion, difficulty with gait, muscle pain or joint pain and swelling.  Skin: Denies redness, rashes, lesions or ulcercations.  Neurological: Denies dizziness, difficulty with memory, difficulty with speech or problems with balance and coordination.  Psych: Pt has a history of anxiety. Denies depression, SI/HI.  No other specific complaints in a complete review of systems (except as listed in HPI above).     Objective:   Physical Exam  BP 126/74   Pulse 78   Temp (!) 97 F (36.1 C) (Temporal)   Ht 5' 3.25" (1.607 m)   Wt 163 lb (73.9 kg)   SpO2 98%   BMI 28.65 kg/m   Wt Readings from Last 3 Encounters:  06/25/19 171 lb (77.6 kg)  01/19/18 144 lb (65.3 kg)    General: Appears her stated age, well developed, well nourished in NAD. Skin: Warm, dry and intact. No rashes noted. HEENT: Head: normal shape and size; Eyes: sclera white, no icterus, conjunctiva pink, PERRLA and EOMs intact;  Neck:  Neck supple, trachea midline. No masses, lumps or thyromegaly present.  Cardiovascular: Normal rate and rhythm. S1,S2 noted.  No murmur, rubs or gallops noted. No JVD or BLE edema. No carotid bruits noted. Pulmonary/Chest: Normal effort and positive vesicular breath sounds. No respiratory distress. No wheezes, rales or ronchi noted.   Abdomen: Soft and nontender. Normal bowel sounds. No distention or masses noted. Liver, spleen and kidneys non palpable. Musculoskeletal: Strength 5/5 BUE/BLE. No difficulty with gait.  Neurological: Alert and oriented. Cranial nerves II-XII grossly intact. Coordination normal.  Psychiatric: Mood and affect normal. Behavior is normal. Judgment and thought content normal.     BMET    Component Value Date/Time   NA 137 12/25/2011 2114   K 3.5 12/25/2011 2114   CL 102 12/25/2011 2114   CO2 24 12/25/2011 2114   GLUCOSE 102 (H) 12/25/2011 2114   BUN 7 12/25/2011 2114   CREATININE 0.78 12/25/2011 2114   CALCIUM 9.0 12/25/2011 2114   GFRNONAA >60 12/25/2011 2114   GFRAA >60 12/25/2011 2114    Lipid Panel  No results found for: CHOL, TRIG, HDL, CHOLHDL, VLDL, LDLCALC  CBC    Component Value Date/Time   WBC 12.2 (H) 12/25/2011 2114   RBC 4.87 12/25/2011 2114   HGB 15.1 12/25/2011 2114   HCT 43.0 12/25/2011 2114   PLT 207 12/25/2011 2114   MCV 88 12/25/2011 2114   MCH 30.9 12/25/2011 2114   MCHC 35.0 12/25/2011 2114   RDW 12.9 12/25/2011 2114    Hgb A1C No results found for: HGBA1C          Assessment & Plan:   Preventative health maintenance:  Encouraged her to get a flu shot in the fall Tetanus UTD Pap smear UTD Mammogram UTD Colon screening UTD Encouraged her to consume a balanced diet and exercise regimen Advised her to see an eye doctor and dentist annually We will check CBC, C met, lipid and vitamin D today  RTC in 1 year, sooner if needed Webb Silversmith, NP This visit occurred during the SARS-CoV-2 public health emergency.  Safety protocols were in place, including screening questions prior to the visit, additional usage of staff PPE, and extensive cleaning of exam room while observing appropriate contact time as indicated for disinfecting solutions.

## 2019-07-31 NOTE — Patient Instructions (Signed)
Health Maintenance, Female Adopting a healthy lifestyle and getting preventive care are important in promoting health and wellness. Ask your health care provider about:  The right schedule for you to have regular tests and exams.  Things you can do on your own to prevent diseases and keep yourself healthy. What should I know about diet, weight, and exercise? Eat a healthy diet   Eat a diet that includes plenty of vegetables, fruits, low-fat dairy products, and lean protein.  Do not eat a lot of foods that are high in solid fats, added sugars, or sodium. Maintain a healthy weight Body mass index (BMI) is used to identify weight problems. It estimates body fat based on height and weight. Your health care provider can help determine your BMI and help you achieve or maintain a healthy weight. Get regular exercise Get regular exercise. This is one of the most important things you can do for your health. Most adults should:  Exercise for at least 150 minutes each week. The exercise should increase your heart rate and make you sweat (moderate-intensity exercise).  Do strengthening exercises at least twice a week. This is in addition to the moderate-intensity exercise.  Spend less time sitting. Even light physical activity can be beneficial. Watch cholesterol and blood lipids Have your blood tested for lipids and cholesterol at 51 years of age, then have this test every 5 years. Have your cholesterol levels checked more often if:  Your lipid or cholesterol levels are high.  You are older than 51 years of age.  You are at high risk for heart disease. What should I know about cancer screening? Depending on your health history and family history, you may need to have cancer screening at various ages. This may include screening for:  Breast cancer.  Cervical cancer.  Colorectal cancer.  Skin cancer.  Lung cancer. What should I know about heart disease, diabetes, and high blood  pressure? Blood pressure and heart disease  High blood pressure causes heart disease and increases the risk of stroke. This is more likely to develop in people who have high blood pressure readings, are of African descent, or are overweight.  Have your blood pressure checked: ? Every 3-5 years if you are 18-39 years of age. ? Every year if you are 40 years old or older. Diabetes Have regular diabetes screenings. This checks your fasting blood sugar level. Have the screening done:  Once every three years after age 40 if you are at a normal weight and have a low risk for diabetes.  More often and at a younger age if you are overweight or have a high risk for diabetes. What should I know about preventing infection? Hepatitis B If you have a higher risk for hepatitis B, you should be screened for this virus. Talk with your health care provider to find out if you are at risk for hepatitis B infection. Hepatitis C Testing is recommended for:  Everyone born from 1945 through 1965.  Anyone with known risk factors for hepatitis C. Sexually transmitted infections (STIs)  Get screened for STIs, including gonorrhea and chlamydia, if: ? You are sexually active and are younger than 51 years of age. ? You are older than 51 years of age and your health care provider tells you that you are at risk for this type of infection. ? Your sexual activity has changed since you were last screened, and you are at increased risk for chlamydia or gonorrhea. Ask your health care provider if   you are at risk.  Ask your health care provider about whether you are at high risk for HIV. Your health care provider may recommend a prescription medicine to help prevent HIV infection. If you choose to take medicine to prevent HIV, you should first get tested for HIV. You should then be tested every 3 months for as long as you are taking the medicine. Pregnancy  If you are about to stop having your period (premenopausal) and  you may become pregnant, seek counseling before you get pregnant.  Take 400 to 800 micrograms (mcg) of folic acid every day if you become pregnant.  Ask for birth control (contraception) if you want to prevent pregnancy. Osteoporosis and menopause Osteoporosis is a disease in which the bones lose minerals and strength with aging. This can result in bone fractures. If you are 65 years old or older, or if you are at risk for osteoporosis and fractures, ask your health care provider if you should:  Be screened for bone loss.  Take a calcium or vitamin D supplement to lower your risk of fractures.  Be given hormone replacement therapy (HRT) to treat symptoms of menopause. Follow these instructions at home: Lifestyle  Do not use any products that contain nicotine or tobacco, such as cigarettes, e-cigarettes, and chewing tobacco. If you need help quitting, ask your health care provider.  Do not use street drugs.  Do not share needles.  Ask your health care provider for help if you need support or information about quitting drugs. Alcohol use  Do not drink alcohol if: ? Your health care provider tells you not to drink. ? You are pregnant, may be pregnant, or are planning to become pregnant.  If you drink alcohol: ? Limit how much you use to 0-1 drink a day. ? Limit intake if you are breastfeeding.  Be aware of how much alcohol is in your drink. In the [place] one drink equals one 12 oz bottle of beer (355 mL), one 5 oz glass of wine (148 mL), or one 1 oz glass of hard liquor (44 mL). General instructions  Schedule regular health, dental, and eye exams.  Stay current with your vaccines.  Tell your health care provider if: ? You often feel depressed. ? You have ever been abused or do not feel safe at home. Summary  Adopting a healthy lifestyle and getting preventive care are important in promoting health and wellness.  Follow your health care provider's instructions about healthy  diet, exercising, and getting tested or screened for diseases.  Follow your health care provider's instructions on monitoring your cholesterol and blood pressure. This information is not intended to replace advice given to you by your health care provider. Make sure you discuss any questions you have with your health care provider. Document Revised: 03/15/2018 Document Reviewed: 03/15/2018 Elsevier Patient Education  2020 Elsevier Inc.  

## 2019-07-31 NOTE — Assessment & Plan Note (Signed)
Continue Sertraline, Buspar and Hydroxyzine Will monitor

## 2019-07-31 NOTE — Assessment & Plan Note (Signed)
Controlled on Bisoprolol HCT Will monitor

## 2019-07-31 NOTE — Assessment & Plan Note (Signed)
Continue Trazadone as needed

## 2019-08-01 LAB — COMPREHENSIVE METABOLIC PANEL
ALT: 13 U/L (ref 0–35)
AST: 19 U/L (ref 0–37)
Albumin: 4.3 g/dL (ref 3.5–5.2)
Alkaline Phosphatase: 88 U/L (ref 39–117)
BUN: 23 mg/dL (ref 6–23)
CO2: 28 mEq/L (ref 19–32)
Calcium: 9.7 mg/dL (ref 8.4–10.5)
Chloride: 99 mEq/L (ref 96–112)
Creatinine, Ser: 0.89 mg/dL (ref 0.40–1.20)
GFR: 67.02 mL/min (ref >60.00)
Glucose, Bld: 104 mg/dL — ABNORMAL HIGH (ref 70–99)
Potassium: 4 mEq/L (ref 3.5–5.1)
Sodium: 136 mEq/L (ref 135–145)
Total Bilirubin: 0.3 mg/dL (ref 0.2–1.2)
Total Protein: 7 g/dL (ref 6.0–8.3)

## 2019-08-01 LAB — LIPID PANEL
Cholesterol: 179 mg/dL (ref 0–200)
HDL: 55.4 mg/dL (ref >39.00)
LDL Cholesterol: 110 mg/dL — ABNORMAL HIGH (ref 0–99)
NonHDL: 123.41
Total CHOL/HDL Ratio: 3
Triglycerides: 66 mg/dL (ref 0.0–149.0)
VLDL: 13.2 mg/dL (ref 0.0–40.0)

## 2019-08-01 LAB — CBC
HCT: 39.3 % (ref 36.0–46.0)
Hemoglobin: 13.1 g/dL (ref 12.0–15.0)
MCHC: 33.3 g/dL (ref 30.0–36.0)
MCV: 87.9 fl (ref 78.0–100.0)
Platelets: 167 10*3/uL (ref 150.0–400.0)
RBC: 4.47 Mil/uL (ref 3.87–5.11)
RDW: 13.6 % (ref 11.5–15.5)
WBC: 6.7 10*3/uL (ref 4.0–10.5)

## 2019-08-01 LAB — VITAMIN D 25 HYDROXY (VIT D DEFICIENCY, FRACTURES): VITD: 33.99 ng/mL (ref 30.00–100.00)

## 2019-08-02 ENCOUNTER — Other Ambulatory Visit: Payer: Self-pay | Admitting: Internal Medicine

## 2019-08-06 ENCOUNTER — Other Ambulatory Visit: Payer: Self-pay | Admitting: Internal Medicine

## 2019-10-29 ENCOUNTER — Other Ambulatory Visit: Payer: Self-pay | Admitting: Internal Medicine

## 2020-01-07 DIAGNOSIS — Z20822 Contact with and (suspected) exposure to covid-19: Secondary | ICD-10-CM | POA: Diagnosis not present

## 2020-01-28 ENCOUNTER — Other Ambulatory Visit: Payer: Self-pay | Admitting: Internal Medicine

## 2020-01-29 ENCOUNTER — Other Ambulatory Visit: Payer: Self-pay | Admitting: Internal Medicine

## 2020-02-06 ENCOUNTER — Other Ambulatory Visit: Payer: Self-pay | Admitting: Internal Medicine

## 2020-03-05 ENCOUNTER — Other Ambulatory Visit: Payer: Self-pay | Admitting: Internal Medicine

## 2020-04-09 ENCOUNTER — Other Ambulatory Visit: Payer: Self-pay | Admitting: Internal Medicine

## 2020-04-15 DIAGNOSIS — M25561 Pain in right knee: Secondary | ICD-10-CM | POA: Diagnosis not present

## 2020-04-30 DIAGNOSIS — Z1211 Encounter for screening for malignant neoplasm of colon: Secondary | ICD-10-CM | POA: Diagnosis not present

## 2020-04-30 DIAGNOSIS — Z01419 Encounter for gynecological examination (general) (routine) without abnormal findings: Secondary | ICD-10-CM | POA: Diagnosis not present

## 2020-04-30 DIAGNOSIS — K219 Gastro-esophageal reflux disease without esophagitis: Secondary | ICD-10-CM | POA: Diagnosis not present

## 2020-04-30 DIAGNOSIS — Z1231 Encounter for screening mammogram for malignant neoplasm of breast: Secondary | ICD-10-CM | POA: Diagnosis not present

## 2020-05-08 DIAGNOSIS — U071 COVID-19: Secondary | ICD-10-CM | POA: Diagnosis not present

## 2020-05-08 DIAGNOSIS — Z20822 Contact with and (suspected) exposure to covid-19: Secondary | ICD-10-CM | POA: Diagnosis not present

## 2020-05-14 ENCOUNTER — Ambulatory Visit: Payer: 59 | Admitting: Surgery

## 2020-05-21 ENCOUNTER — Ambulatory Visit (INDEPENDENT_AMBULATORY_CARE_PROVIDER_SITE_OTHER): Payer: BC Managed Care – PPO | Admitting: Surgery

## 2020-05-21 ENCOUNTER — Encounter: Payer: Self-pay | Admitting: Surgery

## 2020-05-21 ENCOUNTER — Other Ambulatory Visit: Payer: Self-pay

## 2020-05-21 VITALS — BP 128/82 | HR 70 | Temp 97.9°F | Ht 63.0 in | Wt 159.8 lb

## 2020-05-21 DIAGNOSIS — K449 Diaphragmatic hernia without obstruction or gangrene: Secondary | ICD-10-CM | POA: Diagnosis not present

## 2020-05-21 NOTE — Patient Instructions (Addendum)
A CT and Barium Swallow has been scheduled for 06/04/2020 @ 8 am (CT) and       10 am for Barium Swallow. Nothing to eat or drink 4 hours prior to CT and pick up contrast at any [facility] Radiology Department between now and the day before your appointment. A referral has been placed to GI. They will call you for an appointment.  If you have any concerns or questions, please feel free to call our office. See follow up appointment below.     Hiatal Hernia  A hiatal hernia occurs when part of the stomach slides above the muscle that separates the abdomen from the chest (diaphragm). A person can be born with a hiatal hernia (congenital), or it may develop over time. In almost all cases of hiatal hernia, only the top part of the stomach pushes through the diaphragm. Many people have a hiatal hernia with no symptoms. The larger the hernia, the more likely it is that you will have symptoms. In some cases, a hiatal hernia allows stomach acid to flow back into the tube that carries food from your mouth to your stomach (esophagus). This may cause heartburn symptoms. Severe heartburn symptoms may mean that you have developed a condition called gastroesophageal reflux disease (GERD). What are the causes? This condition is caused by a weakness in the opening (hiatus) where the esophagus passes through the diaphragm to attach to the upper part of the stomach. A person may be born with a weakness in the hiatus, or a weakness can develop over time. What increases the risk? This condition is more likely to develop in:  Older people. Age is a major risk factor for a hiatal hernia, especially if you are over the age of 43.  Pregnant women.  People who are overweight.  People who have frequent constipation. What are the signs or symptoms? Symptoms of this condition usually develop in the form of GERD symptoms. Symptoms include:  Heartburn.  Belching.  Indigestion.  Trouble swallowing.  Coughing or  wheezing.  Sore throat.  Hoarseness.  Chest pain.  Nausea and vomiting. How is this diagnosed? This condition may be diagnosed during testing for GERD. Tests that may be done include:  X-rays of your stomach or chest.  An upper gastrointestinal (GI) series. This is an X-ray exam of your GI tract that is taken after you swallow a chalky liquid that shows up clearly on the X-ray.  Endoscopy. This is a procedure to look into your stomach using a thin, flexible tube that has a tiny camera and light on the end of it. How is this treated? This condition may be treated by:  Dietary and lifestyle changes to help reduce GERD symptoms.  Medicines. These may include: ? Over-the-counter antacids. ? Medicines that make your stomach empty more quickly. ? Medicines that block the production of stomach acid (H2 blockers). ? Stronger medicines to reduce stomach acid (proton pump inhibitors).  Surgery to repair the hernia, if other treatments are not helping. If you have no symptoms, you may not need treatment. Follow these instructions at home: Lifestyle and activity  Do not use any products that contain nicotine or tobacco, such as cigarettes and e-cigarettes. If you need help quitting, ask your health care provider.  Try to achieve and maintain a healthy body weight.  Avoid putting pressure on your abdomen. Anything that puts pressure on your abdomen increases the amount of acid that may be pushed up into your esophagus. ? Avoid  bending over, especially after eating. ? Raise the head of your bed by putting blocks under the legs. This keeps your head and esophagus higher than your stomach. ? Do not wear tight clothing around your chest or stomach. ? Try not to strain when having a bowel movement, when urinating, or when lifting heavy objects. Eating and drinking  Avoid foods that can worsen GERD symptoms. These may include: ? Fatty foods, like fried foods. ? Citrus fruits, like oranges  or lemon. ? Other foods and drinks that contain acid, like orange juice or tomatoes. ? Spicy food. ? Chocolate.  Eat frequent small meals instead of three large meals a day. This helps prevent your stomach from getting too full. ? Eat slowly. ? Do not lie down right after eating. ? Do not eat 1-2 hours before bed.  Do not drink beverages with caffeine. These include cola, coffee, cocoa, and tea.  Do not drink alcohol. General instructions  Take over-the-counter and prescription medicines only as told by your health care provider.  Keep all follow-up visits as told by your health care provider. This is important. Contact a health care provider if:  Your symptoms are not controlled with medicines or lifestyle changes.  You are having trouble swallowing.  You have coughing or wheezing that will not go away. Get help right away if:  Your pain is getting worse.  Your pain spreads to your arms, neck, jaw, teeth, or back.  You have shortness of breath.  You sweat for no reason.  You feel sick to your stomach (nauseous) or you vomit.  You vomit blood.  You have bright red blood in your stools.  You have black, tarry stools. This information is not intended to replace advice given to you by your health care provider. Make sure you discuss any questions you have with your health care provider. Document Revised: 03/04/2017 Document Reviewed: 10/25/2016 Elsevier Patient Education  [place].

## 2020-05-23 NOTE — Progress Notes (Signed)
Patient ID: Kristen Macdonald, female   DOB: Apr 10, 1970, 50 y.o.   MRN: [id number]  HPI Kristen Macdonald is a 50 y.o. female seen in consultation at the request of Dr. Feliberto Gottron for recalcitrant reflux and paraesophageal hernia.  Patient reports that she had an EGD about 2 to 3 years ago by Dr. Marva Panda showing evidence of paraesophageal hernia.  She takes PPI on a daily basis with significant improvement of symptoms.  She says that currently she gets reflux and cough only when she stops taking the PPI.  She denies any prior esophageal manipulations or surgeries.  No prior abdominal operations.  She Did have an upper GI series in 2007 and I have reviewed the report. No gastric emptying issues. BMP and CBC was nml. She is able to perform more than 4 MET w/o SOB or CP. She is very functional.  She Also had a screen mammogram last year that I personally reviewed showing no evidence of concerning lesions      HPI  Past Medical History:  Diagnosis Date  . GERD (gastroesophageal reflux disease)   . Hyperlipidemia   . Hypertension     Past Surgical History:  Procedure Laterality Date  . TONSILLECTOMY      Family History  Problem Relation Age of Onset  . Breast cancer Mother   . Hypertension Mother   . Anxiety disorder Mother   . Heart disease Father     Social History Social History   Tobacco Use  . Smoking status: Never Smoker  . Smokeless tobacco: Never Used  Substance Use Topics  . Alcohol use: Yes    Comment: occasional  . Drug use: Never    Allergies  Allergen Reactions  . Codeine Nausea Only    Current Outpatient Medications  Medication Sig Dispense Refill  . bisoprolol-hydrochlorothiazide (ZIAC) 5-6.25 MG tablet Take 1 tablet by mouth daily.  2  . busPIRone (BUSPAR) 5 MG tablet TAKE 1 TABLET (5 MG TOTAL) BY MOUTH 3 (THREE) TIMES DAILY AS NEEDED. 270 tablet 0  . Calcium Carb-Cholecalciferol 600-800 MG-UNIT TABS Take by mouth.    . hydrOXYzine (ATARAX/VISTARIL) 10 MG  tablet TAKE 1 TABLET BY MOUTH EVERY DAY AS NEEDED 90 tablet 0  . Omega-3 Fatty Acids (FISH OIL) 1000 MG CAPS Take by mouth.    . sertraline (ZOLOFT) 100 MG tablet Take 1 tablet (100 mg total) by mouth daily. MUST SCHEDULE PHYSICAL EXAM 90 tablet 0  . traZODone (DESYREL) 50 MG tablet Take 2 tablets (100 mg total) by mouth at bedtime. MUST SCHEDULE PHYSICAL EXAM 180 tablet 0   No current facility-administered medications for this visit.     Review of Systems Full ROS  was asked and was negative except for the information on the HPI  Physical Exam Blood pressure 128/82, pulse 70, temperature 97.9 F (36.6 C), temperature source Oral, height 5\' 3"  (1.6 m), weight 159 lb 12.8 oz (72.5 kg), SpO2 97 %. CONSTITUTIONAL: NAD EYES: Pupils are equal, round, and reactive to light, Sclera are non-icteric. EARS, NOSE, MOUTH AND THROAT:wearing a mask, Hearing is intact to voice. LYMPH NODES:  Lymph nodes in the neck are normal. RESPIRATORY:  Lungs are clear. There is normal respiratory effort, with equal breath sounds bilaterally, and without pathologic use of accessory muscles. CARDIOVASCULAR: Heart is regular without murmurs, gallops, or rubs. GI: The abdomen is soft, nontender, and nondistended. There are no palpable masses. There is no hepatosplenomegaly. There are normal bowel sounds in all quadrants. GU: Rectal deferred.  MUSCULOSKELETAL: Normal muscle strength and tone. No cyanosis or edema.   SKIN: Turgor is good and there are no pathologic skin lesions or ulcers. NEUROLOGIC: Motor and sensation is grossly normal. Cranial nerves are grossly intact. PSYCH:  Oriented to person, place and time. Affect is normal.  Data Reviewed I have personally reviewed the patient's imaging, laboratory findings and medical records.    Assessment/Plan 50 year old female with hiatal hernia currently seems to be controlled with medical therapy.  She might be interested in surgical solution for her problem.   Discussed with the patient in detail about her disease process.  I do think that we will need to establish a baseline on her mediastinal and intra-abdominal anatomy as well as endoscopic anatomy.  We will start work-up with CT scan of the abdomen pelvis as well as a repeat EGD.  I do also think that she will benefit from a barium swallow to assess esophageal motility and swallowing function.  Currently there is no need for any immediate surgical intervention at this time.  After she completes her work-up we will talk once again whether or not she will be a good candidate for hiatal hernia repair.  Extensive counseling provided A copy of this report was sent to the referring provider   Caroleen Hamman, MD FACS General Surgeon 05/23/2020, 1:35 PM

## 2020-06-02 ENCOUNTER — Other Ambulatory Visit: Payer: Self-pay

## 2020-06-02 ENCOUNTER — Ambulatory Visit
Admission: RE | Admit: 2020-06-02 | Discharge: 2020-06-02 | Disposition: A | Discharge status: Home / Self Care | Payer: BC Managed Care – PPO | Source: Ambulatory Visit | Attending: Surgery | Admitting: Surgery

## 2020-06-02 DIAGNOSIS — K449 Diaphragmatic hernia without obstruction or gangrene: Secondary | ICD-10-CM | POA: Diagnosis not present

## 2020-06-02 DIAGNOSIS — K219 Gastro-esophageal reflux disease without esophagitis: Secondary | ICD-10-CM | POA: Diagnosis not present

## 2020-06-03 ENCOUNTER — Telehealth: Payer: Self-pay

## 2020-06-03 NOTE — Telephone Encounter (Signed)
Pt notified of BS test. Verbalizes understanding.

## 2020-06-04 ENCOUNTER — Other Ambulatory Visit: Payer: Self-pay

## 2020-06-04 ENCOUNTER — Other Ambulatory Visit: Payer: BC Managed Care – PPO

## 2020-06-04 ENCOUNTER — Ambulatory Visit: Payer: BC Managed Care – PPO

## 2020-06-04 DIAGNOSIS — K449 Diaphragmatic hernia without obstruction or gangrene: Secondary | ICD-10-CM

## 2020-06-11 ENCOUNTER — Ambulatory Visit
Admission: RE | Admit: 2020-06-11 | Discharge: 2020-06-11 | Disposition: A | Discharge status: Home / Self Care | Payer: BC Managed Care – PPO | Source: Ambulatory Visit | Attending: Surgery | Admitting: Surgery

## 2020-06-11 ENCOUNTER — Other Ambulatory Visit: Payer: Self-pay

## 2020-06-11 DIAGNOSIS — K449 Diaphragmatic hernia without obstruction or gangrene: Secondary | ICD-10-CM | POA: Insufficient documentation

## 2020-06-11 DIAGNOSIS — K760 Fatty (change of) liver, not elsewhere classified: Secondary | ICD-10-CM | POA: Diagnosis not present

## 2020-06-11 LAB — POCT I-STAT CREATININE: Creatinine, Ser: 0.9 mg/dL (ref 0.44–1.00)

## 2020-06-11 MED ORDER — IOHEXOL 300 MG/ML  SOLN
100.0000 mL | Freq: Once | INTRAMUSCULAR | Status: AC | PRN
  Administered 2020-06-11: 100 mL via INTRAVENOUS

## 2020-06-16 ENCOUNTER — Encounter: Payer: Self-pay | Admitting: Surgery

## 2020-06-16 ENCOUNTER — Other Ambulatory Visit: Payer: Self-pay

## 2020-06-16 ENCOUNTER — Ambulatory Visit (INDEPENDENT_AMBULATORY_CARE_PROVIDER_SITE_OTHER): Payer: BC Managed Care – PPO | Admitting: Surgery

## 2020-06-16 VITALS — BP 119/85 | HR 66 | Temp 97.7°F | Ht 63.0 in | Wt 152.0 lb

## 2020-06-16 DIAGNOSIS — K449 Diaphragmatic hernia without obstruction or gangrene: Secondary | ICD-10-CM | POA: Diagnosis not present

## 2020-06-16 NOTE — Progress Notes (Signed)
Outpatient Surgical Follow Up  06/16/2020  Kristen Macdonald is an 50 y.o. female.   Chief Complaint  Patient presents with  . Follow-up    HPI: This is a 50 year old following him for paraesophageal hernia.  She underwent a CT scan of the abdomen pelvis as well as a barium swallow.  I have personally reviewed the images showing evidence of a type III paraesophageal hernia moderate in size.  I have also personally reviewed the EGD performed on April 20, 2019 by Dr. Alice Macdonald.  She did have a widely patent Schatzki ring and reflux.  There was also confirmation of a hiatal hernia. She is feeling well.  She reports that she has some dysphagia and reflux only when she comes off the PPI.  She is able to perform more than 4 METS of activity without any shortness of breath or chest pain.  Past Medical History:  Diagnosis Date  . GERD (gastroesophageal reflux disease)   . Hyperlipidemia   . Hypertension     Past Surgical History:  Procedure Laterality Date  . TONSILLECTOMY      Family History  Problem Relation Age of Onset  . Breast cancer Mother   . Hypertension Mother   . Anxiety disorder Mother   . Heart disease Father     Social History:  reports that she has never smoked. She has never used smokeless tobacco. She reports current alcohol use. She reports that she does not use drugs.  Allergies:  Allergies  Allergen Reactions  . Codeine Nausea Only    Medications reviewed.    ROS Full ROS performed and is otherwise negative other than what is stated in HPI   BP 119/85   Pulse 66   Temp 97.7 F (36.5 C)   Ht 5\' 3"  (1.6 m)   Wt 152 lb (68.9 kg)   SpO2 98%   BMI 26.93 kg/m   Physical Exam Vitals and nursing note reviewed. Exam conducted with a chaperone present.  Constitutional:      General: She is not in acute distress.    Appearance: Normal appearance. She is normal weight.  Eyes:     General: No scleral icterus.       Right eye: No discharge.        Left eye:  No discharge.     Extraocular Movements: Extraocular movements intact.  Cardiovascular:     Rate and Rhythm: Normal rate and regular rhythm.  Pulmonary:     Effort: Pulmonary effort is normal. No respiratory distress.     Breath sounds: Normal breath sounds. No stridor.  Abdominal:     General: Abdomen is flat. There is no distension.     Palpations: Abdomen is soft. There is no mass.     Tenderness: There is no abdominal tenderness. There is no guarding or rebound.     Hernia: No hernia is present.  Musculoskeletal:     Cervical back: Normal range of motion and neck supple. No rigidity or tenderness.  Lymphadenopathy:     Cervical: No cervical adenopathy.  Skin:    General: Skin is warm and dry.     Capillary Refill: Capillary refill takes less than 2 seconds.  Neurological:     General: No focal deficit present.     Mental Status: She is alert and oriented to person, place, and time.  Psychiatric:        Mood and Affect: Mood normal.        Behavior: Behavior normal.  Thought Content: Thought content normal.        Judgment: Judgment normal.      Assessment/Plan: 50 year old female with paraesophageal hernia type 3.  Patient has symptoms only if she comes off PPIs.  An extensive discussion with the patient regarding her disease process was held.  Options of continuation of medical management and observation versus repair were discussed with the patient at length.  I did discuss the postoperative course and the detail of the potential surgery.  I do think that she will be a good candidate for robotic approach if she decides to move forward with the repair.  I encouraged her to think about it and talk to her family.  Of note patient's mother had a history of a complicated paraesophageal hernia that required emergency surgery.  Patient is concerned that this may turn into a potential complication.  I do think that at this point we have time and we can perform this in the elective  setting.  If she wishes to come off her PPI medications certainly surgical intervention will help with this. I also explained to her that I do think that at some point time she may require repair and the question will be the timing.  I will see her back in a couple months and in the interim I will order a CMP INR as well as a CBC to follow-up with her fatty liver.   Greater than 50%  40 minutes  visit was spent in counseling/coordination of care   Caroleen Hamman, MD [place] Surgeon

## 2020-06-16 NOTE — Patient Instructions (Addendum)
Follow up with [place] in a couple of months. Have your labs done anytime at [facility], [facility] entrance.   Please call and ask to speak with a nurse if you develop questions or concerns.    Laparoscopic Nissen Fundoplication Laparoscopic Nissen fundoplication is surgery to relieve heartburn and other problems caused by gastric fluids flowing up into your esophagus. The esophagus is the tube that carries food and liquid from your throat to your stomach. Normally, the muscle that sits between your stomach and your esophagus (lower esophageal sphincter or LES) keeps stomach fluids in your stomach. In some people, the LES does not work properly, and stomach fluids flow up into the esophagus. This can happen when part of the stomach bulges through the LES (hiatal hernia). The backward flow of stomach fluids can cause a type of severe and long-standing heartburn that is called gastroesophageal reflux disease (GERD). You may need this surgery if other treatments for GERD have not helped. In a laparoscopic Nissen fundoplication, the upper part of your stomach is wrapped around the lower part of your esophagus to strengthen the LES and prevent reflux. If you have a hiatal hernia, it will also be repaired with this surgery. The procedure is done through several small incisions in your abdomen. It is performed using a thin, telescopic instrument (laparoscope) and other instruments that can pass through the scope or through other small incisions. Tell a health care provider about:  Any allergies you have.  All medicines you are taking, including vitamins, herbs, eye drops, creams, and over-the-counter medicines.  Any problems you or family members have had with anesthetic medicines.  Any blood disorders you have.  Any surgeries you have had.  Any medical conditions you have. What are the risks? Generally, this is a safe procedure. However, problems may occur, including:  Difficulty swallowing  (dysphagia).  Bloating.  Nausea or vomiting.  Damage to the lung, causing a collapsed lung.  Infection or bleeding. What happens before the procedure?  Ask your health care provider about:  Changing or stopping your regular medicines. This is especially important if you are taking diabetes medicines or blood thinners.  Taking medicines such as aspirin and ibuprofen. These medicines can thin your blood. Do not take these medicines before your procedure if your health care provider asks you not to.  Follow your health care provider's instructions about eating or drinking restrictions.  Plan to have someone take you home after the procedure. What happens during the procedure?  An IV tube will be inserted into one of your veins. It will be used to give you fluids and medicines during the procedure.  You will be given a medicine that makes you fall asleep (general anesthetic).  Your abdomen will be cleaned with a germ-killing solution (antiseptic).  The surgeon will make a small incision in your abdomen and insert a tube through the incision.  Your abdomen will be filled with a gas. This helps the surgeon to see your organs more easily and it makes more space to work.  The surgeon will insert the laparoscope through the incision. The scope has a camera that will send pictures to a monitor in the operating room.  The surgeon will make several other small incisions in your abdomen to insert the other instruments that are needed during the procedure.  Another instrument (dilator) will be passed through your mouth and down your esophagus into the upper part of your stomach. The dilator will prevent your LES from being closed  too tightly during surgery.  The surgeon will pass the top portion of your stomach behind the lower part of your esophagus and wrap it all the way around. This will be stitched into place.  If you have a hiatal hernia, it will be repaired during this  procedure.  All instruments will be removed, and your incisions will be closed under your skin with stitches (sutures). Skin adhesive strips may also be used.  A bandage (dressing) will be placed on your skin over the incisions. The procedure may vary among health care providers and hospitals. What happens after the procedure?  You will be moved to a recovery area.  Your blood pressure, heart rate, breathing rate, and blood oxygen level will be monitored often until the medicines you were given have worn off.  You will be given pain medicine as needed.  Your IV tube will be kept in until you are able to drink fluids. This information is not intended to replace advice given to you by your health care provider. Make sure you discuss any questions you have with your health care provider. Document Released: 04/12/2014 Document Revised: 08/28/2015 Document Reviewed: 11/21/2013 Elsevier Interactive Patient Education  2017 [employer]   Laparoscopic Nissen Fundoplication, Care After Refer to this sheet in the next few weeks. These instructions provide you with information about caring for yourself after your procedure. Your health care provider may also give you more specific instructions. Your treatment has been planned according to current medical practices, but problems sometimes occur. Call your health care provider if you have any problems or questions after your procedure. What can I expect after the procedure? After the procedure, it is common to have:  Difficulty swallowing (dysphagia).  Excess gas (bloating). Follow these instructions at home: Medicines   Take medicines only as directed by your health care provider.  Do not drive or operate heavy machinery while taking pain medicine. Incision care   There are many different ways to close and cover an incision, including stitches (sutures), skin glue, and adhesive strips. Follow your health care provider's instructions  about:  Incision care.  Bandage (dressing) changes and removal.  Incision closure removal.  Check your incision areas every day for signs of infection. Watch for:  Redness, swelling, or pain.  Fluid, blood, or pus.  Do not take baths, swim, or use a hot tub until your health care provider approves. Take showers as directed by your health care provider. Eating and drinking   Follow your health care provider's instructions about eating.  You may need to follow a liquid-only diet for 2 weeks, followed by a diet of soft foods for 2 weeks.  You should return to your usual diet gradually.  Drink enough fluid to keep your urine clear or pale yellow. Activity   Return to your normal activities as directed by your health care provider. Ask your health care provider what activities are safe for you.  Avoid strenuous exercise.  Do not lift anything that is heavier than 10 lb (4.5 kg).  Ask your health care provider when you can:  Return to sexual activity.  Drive.  Go back to work. Contact a health care provider if:  You have a fever.  Your pain gets worse or is not helped by medicine.  You have frequent nausea or vomiting.  You have continued abdominal bloating.  You have an ongoing (persistent) cough.  You have redness, swelling, or pain in any incision areas.  You have fluid, blood,  or pus coming from any incisions. Get help right away if:  You have trouble breathing.  You are unable to swallow.  You have persistent vomiting.  You have blood in your vomit.  You have severe abdominal pain. This information is not intended to replace advice given to you by your health care provider. Make sure you discuss any questions you have with your health care provider. Document Released: 11/13/2003 Document Revised: 08/28/2015 Document Reviewed: 11/21/2013 Elsevier Interactive Patient Education  2017 [place].   Diet After Nissen Fundoplication Surgery This diet  information is for patients who have recently had Nissen fundoplication surgery to correct reflux disease or to repair various types of hernias, such as hiatal hernia and intrathoracic stomach. This diet may also be used for other gastrointestinal surgeries, such as Heller myotomy and repair of achalasia. The diet will help control diarrhea, excess gas and swallowing problems, which may occur after this type of surgery. Keeping Your Stomach from Stretching Eat small, frequent meals (six to eight per day). This will help you consume the majority of the nutrients you need without causing your stomach to feel full or distended.  Drinking large amounts of fluids with meals can stretch your stomach. You may drink fluids between meals as often as you like, but limit fluids to 1/2 cup (4 fluid ounces) with meals and one cup (8 fluid ounces) with snacks.  Sit upright while eating and stay upright for 30 minutes after each meal. Gravity can help food move through your digestive tract. Do not lie down after eating. Sit upright for 2 hours after your last meal or snack of the day.  Eat very slowly. Take your time when eating.  Take small bites and chew your food well to help aid in swallowing and digestion.  Avoid crusty breads and sticky, gummy foods, such as bananas, fresh doughy breads, rolls and doughnuts. These types of foods become sticky and difficult to swallow.  Toasted breads tend to be better tolerated.  Lastly, if you eat sweets, consume them at the end of your meal to avoid a group of symptoms referred to as "dumping syndrome". This describes the rapid emptying of foods from the stomach to the small intestine. Sweetened beverages, candy and desserts move more rapidly and dump quickly into the intestines. This can cause symptoms of nausea, weakness, cold sweats, cramps, diarrhea and dizzy spells.  Avoiding Gas Avoid drinking through a straw. Do not chew gum or tobacco. These actions cause you to swallow  air, which produces excess gas in your stomach. Chew with your mouth closed.  Avoid any foods that cause stomach gas and distention. These foods include corn, dried beans, peas, lentils, onions, broccoli, cauliflower and any food from the cabbage family.  Avoid carbonated drinks, alcohol, citrus and tomato products.  When will I be able to eat a soft diet? After Nissen fundoplication surgery, your diet will be advanced slowly by your surgeon. Generally, you will be on a clear liquid diet for the first few meals. Then you will advance to the full liquid diet for a meal or two and eventually to a Nissen soft diet. Please be aware that each patient's tolerance to food is different. Your doctor will advance your diet depending on how well you progress after surgery. Clear Liquid Diet  The first diet after surgery is the clear liquid diet. It includes the following liquids: Apple juice  Cranberry juice  Grape juice  Chicken broth  Beef broth  Flavored gelatin (  Jell-O)  Decaf tea and coffee  Caffeinated beverages are permitted based on tolerance  Popsicles  [place] ice Carbonated drinks (sodas) are not allowed for the first six to eight weeks after surgery. After this time you can try them again in small amounts.  Full Liquid Diet The full liquid diet contains anything on the clear liquid diet, plus: Milk, soy, rice and almond (no chocolate)  Cream of wheat, cream of rice, grits  Strained creamed soups (no tomato or broccoli)  Vanilla and strawberry-flavored ice cream  Sherbet  Blended, custard styled or whipped yogurt (plain or vanilla only)  Vanilla and butterscotch pudding (no chocolate or coconut)  Nutritional drinks including Ensure, Boost, Carnation Instant Breakfast (no chocolate-flavored) Note: Dairy products, such as milk, ice cream and pudding, may cause diarrhea in some people just after surgery. You may need to avoid milk products. If so, substitute them with lactose-free  beverages, such as soy, rice, Lactaid or almond milks.  Nissen Soft Diet Food Category Foods to Choose Foods to Avoid  Beverages Milk, such as, whole, 2%, 1%, non-fat, or skim, soy, rice, almond  Caffeinated and decaf tea and coffee  Powdered drink mixes (in moderation)  Non-citrus juices (apple, grape, cranberry or blends of these)  Fruit nectars  Nutritional drinks including Boost, Ensure, Carnation Instant Breakfast Chocolate milk, cocoa or other chocolate-flavored drinks  Carbonated drinks  Alcohol  Citrus juices like orange, grapefruit, lemon and lime  Breads Pancakes, [place] toast and waffles  Crackers (saltine, butter, soda, graham, Goldfish and Cheese Nips)  Toasted bread Untoasted bread, bagels, [place] and hard rolls, English muffins  Crackers with nuts, seeds, fresh or dried fruit, coconut, or highly seasoned, such as garlic or onion-flavored  Sweet rolls, coffee cake or doughnuts  Cereals Well cooked cereals, such as oatmeal (plain or flavored)  Cold cereal (Cornflakes, Rice Krispies, Cheerios, Special K plain, Rice Chex and puffed rice) Very coarse cereal, such as bran, shredded wheat  Any cereal with fresh or dried fruit, coconut, seeds or nuts  Desserts Eat in moderation and do not eat desserts or sweets by themselves. Plain cakes, cookies and cream-filled pies  Vanilla and butterscotch pudding or custard  Ice cream, ice milk, frozen yogurt and sherbet  Gelatin made from allowed foods  Fruit ices and popsicles Desserts containing chocolate, coconut, nuts, seeds, fresh or dried fruit, peppermint or spearmint  Eggs  Poached, hard boiled or scrambled Fried eggs and highly seasoned eggs (deviled eggs)  Fats Eat in moderation. Butter and margarine  Mayonnaise and vegetable oils  Mildly seasoned cream sauces and gravies  Plain cream cheese  Sour cream Highly seasoned salad dressings, cream sauces and gravies  Bacon, bacon fat, ham fat, lard and salt pork  Fried  foods  Nuts  Fruits Fruit juice  Any canned or cooked fruit except those listed in the AVOID column ALL fresh fruits, such as citrus, bananas and pineapple  Canned pineapple  Dried fruits, such as raisins, berries  Fruits with seeds, such as berries, kiwi and figs  Meat, Fish, Poultry, and [employer] may be ground, minced or chopped to ease swallowing and digestion  Tender, well cooked and moist cuts of beef, chicken, [place] and pork  Veal and lamb  Flaky, cooked fish  Canned tuna  Cottage and ricotta cheeses  Mild cheese, such as American, brick, mozzarella and baby Swiss  Creamy peanut butter  Plain custard or blended fruit yogurt  Moist casseroles, such as macaroni & cheese, tuna noodle  Grilled or toasted cheese sandwich Tough meats with a lot of gristle  Fried, highly seasoned, smoked and fatty meat, fish or poultry, such as frankfurters, luncheon meats, sausage, bacon, spare ribs, beef brisket, sardines, anchovies, duck and goose  Chili and other entrees made with pepper or chili pepper  Shellfish  Strongly flavored cheeses, such as sharp cheese, extra sharp cheddar, cheese containing peppers or other seasonings  Crunchy peanut butter  Any yogurt with nuts, seeds, coconut, strawberries or raspberries  Potatoes and Starches Peeled, mashed or boiled white or sweet potatoes  Oven-baked potatoes without skin  Well cooked white rice, enriched noodles, barley, spaghetti, macaroni and other pastas Fried potatoes, potato skins and potato chips  Hard and soft taco shells  Fried, brown or wild rice  Soups Mildly flavored meat stocks  Cream soups made from allowed foods Highly seasoned soups and tomato based soups, cream soups made with gas producing vegetables, such as broccoli, cauliflower, onion, etc.  Sweets and Snacks Use in moderation and do not eat large amounts of sweets by themselves. Syrup, honey, jelly and seedless jam  Plain hard candies and plain candies made with  allowed ingredients  Molasses  Marshmallows  Other candy made from allowed ingredients  Thin pretzels Jam, marmalade and preserves  Chocolate in any form  Any candy containing nuts, coconut, seeds, peppermint, spearmint or dried or fresh fruit  Popcorn, potato chips, tortilla chips  Soft or hard thick pretzels, such as sourdough  Vegetables Well cooked soft vegetables without seeds or skins, such as asparagus tips, beets, carrots, green and wax beans, chopped spinach, tender canned baby peas, squash and pumpkin Raw vegetables, tomatoes, tomato juice, tomato sauce and V-8 juice  Gas producing vegetables, such as broccoli, Brussel sprouts, cabbage, cauliflower, onions, corn, cucumber, green peppers, rutabagas, turnips, radishes and sauerkraut  Dried beans, peas and lentils  Miscellaneous Salt and spices in moderation  Mustard and vinegar in moderation Fried or highly seasoned foods  Coconut and seeds  Pickles and olives  Chili sauces, ketchup, barbecue sauce, horseradish, black pepper, chili powder and onion and garlic seasonings  Any other strongly flavored seasoning, condiment, spice or herb not tolerated  Any food not tolerated

## 2020-06-17 ENCOUNTER — Other Ambulatory Visit
Admission: RE | Admit: 2020-06-17 | Discharge: 2020-06-17 | Disposition: A | Discharge status: Home / Self Care | Payer: Commercial Managed Care - PPO | Attending: Surgery | Admitting: Surgery

## 2020-06-17 ENCOUNTER — Telehealth: Payer: Self-pay

## 2020-06-17 DIAGNOSIS — K449 Diaphragmatic hernia without obstruction or gangrene: Secondary | ICD-10-CM | POA: Diagnosis not present

## 2020-06-17 LAB — CBC WITH DIFFERENTIAL/PLATELET
Abs Immature Granulocytes: 0.01 10*3/uL (ref 0.00–0.07)
Basophils Absolute: 0 10*3/uL (ref 0.0–0.1)
Basophils Relative: 0 %
Eosinophils Absolute: 0.1 10*3/uL (ref 0.0–0.5)
Eosinophils Relative: 2 %
HCT: 40.9 % (ref 36.0–46.0)
Hemoglobin: 13.5 g/dL (ref 12.0–15.0)
Immature Granulocytes: 0 %
Lymphocytes Relative: 27 %
Lymphs Abs: 1.6 10*3/uL (ref 0.7–4.0)
MCH: 28.8 pg (ref 26.0–34.0)
MCHC: 33 g/dL (ref 30.0–36.0)
MCV: 87.2 fL (ref 80.0–100.0)
Monocytes Absolute: 0.5 10*3/uL (ref 0.1–1.0)
Monocytes Relative: 9 %
Neutro Abs: 3.8 10*3/uL (ref 1.7–7.7)
Neutrophils Relative %: 62 %
Platelets: 172 10*3/uL (ref 150–400)
RBC: 4.69 MIL/uL (ref 3.87–5.11)
RDW: 13.3 % (ref 11.5–15.5)
WBC: 6.1 10*3/uL (ref 4.0–10.5)
nRBC: 0 % (ref 0.0–0.2)

## 2020-06-17 LAB — COMPREHENSIVE METABOLIC PANEL
ALT: 15 U/L (ref 0–44)
AST: 20 U/L (ref 15–41)
Albumin: 4.5 g/dL (ref 3.5–5.0)
Alkaline Phosphatase: 81 U/L (ref 38–126)
Anion gap: 8 (ref 5–15)
BUN: 22 mg/dL — ABNORMAL HIGH (ref 6–20)
CO2: 26 mmol/L (ref 22–32)
Calcium: 9.8 mg/dL (ref 8.9–10.3)
Chloride: 104 mmol/L (ref 98–111)
Creatinine, Ser: 0.8 mg/dL (ref 0.44–1.00)
GFR, Estimated: >60 mL/min (ref >60)
Glucose, Bld: 93 mg/dL (ref 70–99)
Potassium: 4.3 mmol/L (ref 3.5–5.1)
Sodium: 138 mmol/L (ref 135–145)
Total Bilirubin: 0.7 mg/dL (ref 0.3–1.2)
Total Protein: 7.7 g/dL (ref 6.5–8.1)

## 2020-06-17 LAB — PROTIME-INR
INR: 1 (ref 0.8–1.2)
Prothrombin Time: 12.7 seconds (ref 11.4–15.2)

## 2020-06-17 NOTE — Telephone Encounter (Signed)
Patient notified of lab results. Per Dr Dahlia Byes her liver tests were normal. She will follow up as scheduled.

## 2020-07-07 ENCOUNTER — Other Ambulatory Visit: Payer: Self-pay | Admitting: Internal Medicine

## 2020-07-16 ENCOUNTER — Other Ambulatory Visit: Payer: Self-pay

## 2020-07-17 ENCOUNTER — Ambulatory Visit: Payer: BC Managed Care – PPO | Admitting: Gastroenterology

## 2020-07-17 ENCOUNTER — Encounter: Payer: Self-pay | Admitting: Gastroenterology

## 2020-08-18 ENCOUNTER — Ambulatory Visit: Payer: BC Managed Care – PPO | Admitting: Surgery

## 2020-10-30 ENCOUNTER — Other Ambulatory Visit: Payer: Self-pay | Admitting: Internal Medicine

## 2020-12-05 ENCOUNTER — Other Ambulatory Visit: Payer: Self-pay

## 2020-12-05 ENCOUNTER — Ambulatory Visit
Admission: EM | Admit: 2020-12-05 | Discharge: 2020-12-05 | Disposition: A | Discharge status: Home / Self Care | Payer: Commercial Managed Care - PPO | Attending: Physician Assistant | Admitting: Physician Assistant

## 2020-12-05 DIAGNOSIS — R059 Cough, unspecified: Secondary | ICD-10-CM | POA: Diagnosis not present

## 2020-12-05 DIAGNOSIS — R0981 Nasal congestion: Secondary | ICD-10-CM | POA: Diagnosis not present

## 2020-12-05 DIAGNOSIS — Z20822 Contact with and (suspected) exposure to covid-19: Secondary | ICD-10-CM | POA: Insufficient documentation

## 2020-12-05 DIAGNOSIS — J069 Acute upper respiratory infection, unspecified: Secondary | ICD-10-CM | POA: Insufficient documentation

## 2020-12-05 LAB — SARS CORONAVIRUS 2 (TAT 6-24 HRS): SARS Coronavirus 2: NEGATIVE

## 2020-12-05 MED ORDER — PSEUDOEPH-BROMPHEN-DM 30-2-10 MG/5ML PO SYRP: 10.0000 mL | ORAL_SOLUTION | Freq: Four times a day (QID) | ORAL | 0 refills | Status: AC | PRN

## 2020-12-05 NOTE — ED Provider Notes (Signed)
[facility]    CSN: [id number] Arrival date & time: 12/05/20  0856      History   Chief Complaint Chief Complaint  Patient presents with   Cough    HPI Kristen Macdonald is a 50 y.o. female presenting for 5 day history of fatigue, cough, and congestion.  Patient says initially she had a low-grade fever, chills, and ear pain which have resolved.  Admits to mild sore throat associated with a cough.  No shortness of breath or difficulty breathing.  No nausea/vomiting or diarrhea.  Patient denies any sick contacts and no known exposure to COVID-19.  She states she had an appoint with a telemetry doc couple days ago and was prescribed benzonatate and a nasal spray.  Patient says she is feeling better than she was when symptoms started but still continues to have a cough and the benzonatate does not seem to help that much.  No worsening of any symptoms.  She is otherwise healthy with history of hypertension, hyperlipidemia, GERD and anxiety.  She has no other complaints or concerns.  HPI  Past Medical History:  Diagnosis Date   GERD (gastroesophageal reflux disease)    Hyperlipidemia    Hypertension     Patient Active Problem List   Diagnosis Date Noted   Gastroesophageal reflux disease without esophagitis 01/19/2018   Psychophysiological insomnia 01/19/2018   Essential hypertension 01/19/2018   Anxiety 01/19/2018    Past Surgical History:  Procedure Laterality Date   TONSILLECTOMY      OB History   No obstetric history on file.      Home Medications    Prior to Admission medications   Medication Sig Start Date End Date Taking? Authorizing Provider  bisoprolol-hydrochlorothiazide (ZIAC) 5-6.25 MG tablet Take 1 tablet by mouth daily. 01/11/18  Yes [provider]  brompheniramine-pseudoephedrine-DM 30-2-10 MG/5ML syrup Take 10 mLs by mouth 4 (four) times daily as needed for up to 7 days (cough). 12/05/20 12/12/20 Yes Danton Clap, PA-C  Calcium  Carb-Cholecalciferol 600-800 MG-UNIT TABS Take by mouth.   Yes [provider]  fluticasone (FLONASE) 50 MCG/ACT nasal spray Place 2 sprays into the nose 1 day or 1 dose. 03/06/19  Yes [provider]  Omega-3 Fatty Acids (FISH OIL) 1000 MG CAPS Take by mouth.   Yes [provider]  pantoprazole (PROTONIX) 20 MG tablet Take 1 tablet by mouth daily. 03/06/20  Yes [provider]  busPIRone (BUSPAR) 5 MG tablet TAKE 1 TABLET (5 MG TOTAL) BY MOUTH 3 (THREE) TIMES DAILY AS NEEDED. 01/28/20   Jearld Fenton, NP  meloxicam (MOBIC) 15 MG tablet Take 1 tablet by mouth daily. 05/14/20   [provider]  sertraline (ZOLOFT) 100 MG tablet TAKE 1 TABLET (100 MG TOTAL) BY MOUTH DAILY. MUST SCHEDULE PHYSICAL EXAM 10/30/20   Dutch Quint B, FNP  traZODone (DESYREL) 50 MG tablet Take 2 tablets (100 mg total) by mouth at bedtime. 07/08/20   Jearld Fenton, NP    Family History Family History  Problem Relation Age of Onset   Breast cancer Mother    Hypertension Mother    Anxiety disorder Mother    Heart disease Father     Social History Social History   Tobacco Use   Smoking status: Never   Smokeless tobacco: Never  Substance Use Topics   Alcohol use: Yes    Comment: occasional   Drug use: Never     Allergies   Codeine   Review  of Systems Review of Systems  Constitutional:  Positive for fatigue. Negative for chills, diaphoresis and fever.  HENT:  Positive for congestion and rhinorrhea. Negative for ear pain, sinus pressure, sinus pain and sore throat.   Respiratory:  Positive for cough. Negative for shortness of breath.   Gastrointestinal:  Negative for abdominal pain, nausea and vomiting.  Musculoskeletal:  Negative for arthralgias and myalgias.  Skin:  Negative for rash.  Neurological:  Negative for weakness and headaches.  Hematological:  Negative for adenopathy.    Physical Exam Triage Vital Signs ED Triage Vitals  Enc Vitals Group     BP  12/05/20 0907 136/86     Pulse Rate 12/05/20 0907 92     Resp 12/05/20 0907 18     Temp 12/05/20 0907 99.8 F (37.7 C)     Temp Source 12/05/20 0907 Oral     SpO2 12/05/20 0907 97 %     Weight 12/05/20 0905 154 lb (69.9 kg)     Height 12/05/20 0905 '5\' 3"'$  (1.6 m)     Head Circumference --      Peak Flow --      Pain Score 12/05/20 0905 0     Pain Loc --      Pain Edu? --      Excl. in [place]? --    No data found.  Updated Vital Signs BP 136/86 (BP Location: Right Arm)   Pulse 92   Temp 99.8 F (37.7 C) (Oral)   Resp 18   Ht '5\' 3"'$  (1.6 m)   Wt 154 lb (69.9 kg)   SpO2 97%   BMI 27.28 kg/m      Physical Exam Vitals and nursing note reviewed.  Constitutional:      General: She is not in acute distress.    Appearance: Normal appearance. She is not ill-appearing or toxic-appearing.  HENT:     Head: Normocephalic and atraumatic.     Right Ear: Tympanic membrane, ear canal and external ear normal.     Left Ear: Tympanic membrane, ear canal and external ear normal.     Nose: Nose normal.     Mouth/Throat:     Mouth: Mucous membranes are moist.     Pharynx: Oropharynx is clear.  Eyes:     General: No scleral icterus.       Right eye: No discharge.        Left eye: No discharge.     Conjunctiva/sclera: Conjunctivae normal.  Cardiovascular:     Rate and Rhythm: Normal rate and regular rhythm.     Heart sounds: Normal heart sounds.  Pulmonary:     Effort: Pulmonary effort is normal. No respiratory distress.     Breath sounds: Normal breath sounds. No wheezing, rhonchi or rales.  Musculoskeletal:     Cervical back: Neck supple.  Skin:    General: Skin is dry.  Neurological:     General: No focal deficit present.     Mental Status: She is alert. Mental status is at baseline.     Motor: No weakness.     Gait: Gait normal.  Psychiatric:        Mood and Affect: Mood normal.        Behavior: Behavior normal.        Thought Content: Thought content normal.     UC  Treatments / Results  Labs (all labs ordered are listed, but only abnormal results are displayed) Labs Reviewed  SARS CORONAVIRUS 2 (TAT  6-24 HRS)    EKG   Radiology No results found.  Procedures Procedures (including critical care time)  Medications Ordered in UC Medications - No data to display  Initial Impression / Assessment and Plan / UC Course  I have reviewed the triage vital signs and the nursing notes.  Pertinent labs & imaging results that were available during my care of the patient were reviewed by me and considered in my medical decision making (see chart for details).  50 year old female presenting for 5-day history of cough, congestion and fatigue.  Overall is feeling better but continues to have cough.  Vital signs are all normal and stable and she is overall well-appearing.  She has had 2 negative at home COVID tests.   PCR COVID is obtained.  Current CDC guidelines, isolation protocol and ED precautions reviewed with patient.  Advised patient she has a viral URI.  Advised to increase rest and fluids.  I have sent in Bromfed-DM.  Advised to continue with the nasal spray and Tylenol/Advil as needed.  Advised to follow-up with [place] as needed for any worsening symptoms or if not better in the next week.   Final Clinical Impressions(s) / UC Diagnoses   Final diagnoses:  Acute upper respiratory infection  Cough  Nasal congestion     Discharge Instructions      URI/COLD SYMPTOMS: Your exam today is consistent with a viral illness. Antibiotics are not indicated at this time. Use medications as directed, including cough syrup, nasal saline, and decongestants. Your symptoms should improve over the next few days and resolve within 7-10 days. Increase rest and fluids. F/u if symptoms worsen or predominate such as sore throat, ear pain, productive cough, shortness of breath, or if you develop high fevers or worsening fatigue over the next several days.    You have  received COVID testing today either for positive exposure, concerning symptoms that could be related to COVID infection, screening purposes, or re-testing after confirmed positive.  Your test obtained today checks for active viral infection in the last 1-2 weeks. If your test is negative now, you can still test positive later. So, if you do develop symptoms you should either get re-tested and/or isolate x 5 days and then strict mask use x 5 days (unvaccinated) or mask use x 10 days (vaccinated). Please follow CDC guidelines.  While Rapid antigen tests come back in 15-20 minutes, send out PCR/molecular test results typically come back within 1-3 days. In the mean time, if you are symptomatic, assume this could be a positive test and treat/monitor yourself as if you do have COVID.   We will call with test results if positive. Please download the MyChart app and set up a profile to access test results.   If symptomatic, go home and rest. Push fluids. Take Tylenol as needed for discomfort. Gargle warm salt water. Throat lozenges. Take Mucinex DM or Robitussin for cough. Humidifier in bedroom to ease coughing. Warm showers. Also review the COVID handout for more information.  COVID-19 INFECTION: The incubation period of COVID-19 is approximately 14 days after exposure, with most symptoms developing in roughly 4-5 days. Symptoms may range in severity from mild to critically severe. Roughly 80% of those infected will have mild symptoms. People of any age may become infected with COVID-19 and have the ability to transmit the virus. The most common symptoms include: fever, fatigue, cough, body aches, headaches, sore throat, nasal congestion, shortness of breath, nausea, vomiting, diarrhea, changes in smell and/or taste.  COURSE OF ILLNESS Some patients may begin with mild disease which can progress quickly into critical symptoms. If your symptoms are worsening please call ahead to the Emergency Department and  proceed there for further treatment. Recovery time appears to be roughly 1-2 weeks for mild symptoms and 3-6 weeks for severe disease.   GO IMMEDIATELY TO ER FOR FEVER YOU ARE UNABLE TO GET DOWN WITH TYLENOL, BREATHING PROBLEMS, CHEST PAIN, FATIGUE, LETHARGY, INABILITY TO EAT OR DRINK, ETC  QUARANTINE AND ISOLATION: To help decrease the spread of COVID-19 please remain isolated if you have COVID infection or are highly suspected to have COVID infection. This means -stay home and isolate to one room in the home if you live with others. Do not share a bed or bathroom with others while ill, sanitize and wipe down all countertops and keep common areas clean and disinfected. Stay home for 5 days. If you have no symptoms or your symptoms are resolving after 5 days, you can leave your house. Continue to wear a mask around others for 5 additional days. If you have been in close contact (within 6 feet) of someone diagnosed with COVID 19, you are advised to quarantine in your home for 14 days as symptoms can develop anywhere from 2-14 days after exposure to the virus. If you develop symptoms, you  must isolate.  Most current guidelines for COVID after exposure -unvaccinated: isolate 5 days and strict mask use x 5 days. Test on day 5 is possible -vaccinated: wear mask x 10 days if symptoms do not develop -You do not necessarily need to be tested for COVID if you have + exposure and  develop symptoms. Just isolate at home x10 days from symptom onset During this global pandemic, CDC advises to practice social distancing, try to stay at least 73f away from others at all times. Wear a face covering. Wash and sanitize your hands regularly and avoid going anywhere that is not necessary.  KEEP IN MIND THAT THE COVID TEST IS NOT 100% ACCURATE AND YOU SHOULD STILL DO EVERYTHING TO PREVENT POTENTIAL SPREAD OF VIRUS TO OTHERS (WEAR MASK, WEAR GLOVES, WBertrandHANDS AND SANITIZE REGULARLY). IF INITIAL TEST IS NEGATIVE, THIS MAY  NOT MEAN YOU ARE DEFINITELY NEGATIVE. MOST ACCURATE TESTING IS DONE 5-7 DAYS AFTER EXPOSURE.   It is not advised by CDC to get re-tested after receiving a positive COVID test since you can still test positive for weeks to months after you have already cleared the virus.   *If you have not been vaccinated for COVID, I strongly suggest you consider getting vaccinated as long as there are no contraindications.       ED Prescriptions     Medication Sig Dispense Auth. Provider   brompheniramine-pseudoephedrine-DM 30-2-10 MG/5ML syrup Take 10 mLs by mouth 4 (four) times daily as needed for up to 7 days (cough). 150 mL EDanton Clap PA-C      PDMP not reviewed this encounter.   EDanton Clap PA-C 12/05/20 [phone]

## 2020-12-05 NOTE — ED Triage Notes (Signed)
Pt here with C/O with cough since Sunday. Called telehealth and called in Tessalon pearls and some nasal spray. States she is feeling better was having body aches and ear aches. Has taken at home Covid they were negative.

## 2020-12-05 NOTE — Discharge Instructions (Addendum)

## 2021-01-22 ENCOUNTER — Other Ambulatory Visit: Payer: Self-pay | Admitting: Family

## 2021-03-22 ENCOUNTER — Other Ambulatory Visit: Payer: Self-pay | Admitting: Internal Medicine

## 2021-03-23 NOTE — Telephone Encounter (Signed)
Name of Medication: Buspirone  5 mg Name of Pharmacy: [facility] or Written Date and Quantity: # 270 on 01/28/2020 Last Office Visit and Type: 07/31/2019 for annual exam Next Office Visit and Type: none scheduled  Sending to [place].

## 2021-07-21 ENCOUNTER — Other Ambulatory Visit: Payer: Self-pay | Admitting: Family

## 2021-09-11 DIAGNOSIS — R051 Acute cough: Secondary | ICD-10-CM | POA: Diagnosis not present

## 2021-09-18 ENCOUNTER — Ambulatory Visit (INDEPENDENT_AMBULATORY_CARE_PROVIDER_SITE_OTHER): Payer: BC Managed Care – PPO

## 2021-09-18 ENCOUNTER — Ambulatory Visit
Admission: EM | Admit: 2021-09-18 | Discharge: 2021-09-18 | Disposition: A | Discharge status: Home / Self Care | Payer: BC Managed Care – PPO | Attending: Emergency Medicine | Admitting: Emergency Medicine

## 2021-09-18 ENCOUNTER — Inpatient Hospital Stay (HOSPITAL_COMMUNITY): Admission: RE | Admit: 2021-09-18 | Payer: BC Managed Care – PPO | Source: Ambulatory Visit

## 2021-09-18 DIAGNOSIS — Z760 Encounter for issue of repeat prescription: Secondary | ICD-10-CM

## 2021-09-18 DIAGNOSIS — R058 Other specified cough: Secondary | ICD-10-CM

## 2021-09-18 DIAGNOSIS — K219 Gastro-esophageal reflux disease without esophagitis: Secondary | ICD-10-CM

## 2021-09-18 DIAGNOSIS — R059 Cough, unspecified: Secondary | ICD-10-CM | POA: Diagnosis not present

## 2021-09-18 DIAGNOSIS — K449 Diaphragmatic hernia without obstruction or gangrene: Secondary | ICD-10-CM | POA: Diagnosis not present

## 2021-09-18 MED ORDER — PANTOPRAZOLE SODIUM 20 MG PO TBEC: 20.0000 mg | DELAYED_RELEASE_TABLET | Freq: Every day | ORAL | 0 refills | Status: DC

## 2021-09-18 MED ORDER — PROMETHAZINE-DM 6.25-15 MG/5ML PO SYRP: 5.0000 mL | ORAL_SOLUTION | Freq: Four times a day (QID) | ORAL | 0 refills | Status: DC | PRN

## 2021-09-18 MED ORDER — AEROCHAMBER MV MISC: 1 refills | Status: DC

## 2021-09-18 NOTE — ED Provider Notes (Signed)
HPI  SUBJECTIVE:  Kristen Macdonald is a 51 y.o. female who presents with cough productive of clear sputum for the past 3 weeks.  She reports sinus pain and pressure, postnasal drip at first, but this has since resolved.  She reports fatigue, dyspnea on exertion.  She states that the cough is getting better, but has not completely resolved.  No fevers, nasal congestion, rhinorrhea, facial swelling, dental pain, wheezing, shortness of breath at rest.  She states that she is doing better, sleeping at night but still waking up coughing.  She states that she is taking an over-the-counter medication for her acid reflux which seems to be controlling her GERD symptoms.  She had an E-visit 1 week ago and was prescribed an albuterol inhaler, Tessalon and prednisone 60 mg which she is still taking.  She does not have a spacer for her inhaler.  She states that helped at first.  She has also tried NyQuil, DayQuil.  Symptoms are worse with being active.  No calf pain, swelling, hemoptysis, surgery in the past 4 weeks, exogenous estrogen, recent immobilization.  She has a past medical history of hiatal hernia, GERD, on OTC medication, hypercholesterolemia, hypertension.  No history of diabetes, pulmonary disease, smoking, DVT, PE, cancer.  PCP: Her OB/GYN    Past Medical History:  Diagnosis Date   GERD (gastroesophageal reflux disease)    Hyperlipidemia    Hypertension     Past Surgical History:  Procedure Laterality Date   TONSILLECTOMY      Family History  Problem Relation Age of Onset   Breast cancer Mother    Hypertension Mother    Anxiety disorder Mother    Heart disease Father     Social History   Tobacco Use   Smoking status: Never   Smokeless tobacco: Never  Substance Use Topics   Alcohol use: Yes    Comment: occasional   Drug use: Never    No current facility-administered medications for this encounter.  Current Outpatient Medications:    albuterol (VENTOLIN HFA) 108 (90 Base)  MCG/ACT inhaler, Inhale into the lungs every 6 (six) hours as needed for wheezing or shortness of breath., Disp: , Rfl:    benzonatate (TESSALON) 200 MG capsule, Take 200 mg by mouth 3 (three) times daily as needed for cough., Disp: , Rfl:    predniSONE (DELTASONE) 20 MG tablet, Take 20 mg by mouth daily with breakfast., Disp: , Rfl:    promethazine-dextromethorphan (PROMETHAZINE-DM) 6.25-15 MG/5ML syrup, Take 5 mLs by mouth 4 (four) times daily as needed for cough., Disp: 118 mL, Rfl: 0   Spacer/Aero-Holding Chambers (AEROCHAMBER MV) inhaler, Use as instructed, Disp: 1 each, Rfl: 1   bisoprolol-hydrochlorothiazide (ZIAC) 5-6.25 MG tablet, Take 1 tablet by mouth daily., Disp: , Rfl: 2   busPIRone (BUSPAR) 5 MG tablet, TAKE 1 TABLET BY MOUTH 3 TIMES DAILY AS NEEDED., Disp: 270 tablet, Rfl: 0   Calcium Carb-Cholecalciferol 600-800 MG-UNIT TABS, Take by mouth., Disp: , Rfl:    fluticasone (FLONASE) 50 MCG/ACT nasal spray, Place 2 sprays into the nose 1 day or 1 dose., Disp: , Rfl:    meloxicam (MOBIC) 15 MG tablet, Take 1 tablet by mouth daily., Disp: , Rfl:    pantoprazole (PROTONIX) 20 MG tablet, Take 1 tablet (20 mg total) by mouth daily., Disp: 30 tablet, Rfl: 0   sertraline (ZOLOFT) 100 MG tablet, TAKE 1 TABLET (100 MG TOTAL) BY MOUTH DAILY. MUST SCHEDULE PHYSICAL EXAM, Disp: 90 tablet, Rfl: 0  Allergies  Allergen Reactions   Codeine Nausea Only     ROS  As noted in HPI.   Physical Exam  BP (!) 143/81 (BP Location: Left Arm)   Pulse 91   Temp 98.4 F (36.9 C) (Oral)   Resp 18   SpO2 99%   Constitutional: Well developed, well nourished, no acute distress Eyes:  EOMI, conjunctiva normal bilaterally HENT: Normocephalic, atraumatic,mucus membranes moist.  No nasal congestion.  Normal turbinates.  No maxillary, frontal sinus tenderness.  Tonsils surgically absent.  No postnasal drip. Respiratory: Normal inspiratory effort, lungs clear bilaterally.  No anterior, lateral chest wall  tenderness Cardiovascular: Normal rate, regular rhythm, no murmurs rubs or gallops GI: nondistended skin: No rash, skin intact Musculoskeletal: Calves symmetric, nontender, no edema. Neurologic: Alert & oriented x 3, no focal neuro deficits Psychiatric: Speech and behavior appropriate   ED Course   Medications - No data to display  Orders Placed This Encounter  Procedures   DG Chest 2 View    Standing Status:   Standing    Number of Occurrences:   1    Order Specific Question:   Reason for Exam (SYMPTOM  OR DIAGNOSIS REQUIRED)    Answer:   cough 3 weeks r./o PNA    No results found for this or any previous visit (from the past 24 hour(s)). DG Chest 2 View  Result Date: 09/18/2021 CLINICAL DATA:  A 51 year old female presents for evaluation of cough, concern for pneumonia. EXAM: CHEST - 2 VIEW COMPARISON:  None FINDINGS: Trachea midline. Cardiomediastinal contours and hilar structures are normal. No lobar consolidation.  No sign of pleural effusion. On limited assessment no acute skeletal process. Signs of hiatal hernia. IMPRESSION: No acute cardiopulmonary disease. Small hiatal hernia. Electronically Signed   By: Zetta Bills M.D.   On: 09/18/2021 09:26    ED Clinical Impression  1. Post-viral cough syndrome   2. Gastroesophageal reflux disease without esophagitis   3. Medication refill      ED Assessment/Plan  will check chest x-ray due to duration of symptoms to rule out residual pneumonia.  There does not appear to be any evidence for sinusitis.  Discussed differential of cough with patient.  Suspect postviral cough syndrome aggravated by GERD.  Cannot apply PERC rule due to age.  However, Wells score for PE is 0.  Doubt PE.  Doubt CHF.   Reviewed imaging independently.  Small hiatal hernia.  No pneumonia.  No acute cardiopulmonary disease.  See radiology report for full details.  Plan to send home with a spacer for her albuterol inhaler, regularly scheduled albuterol  for the next 4 days, then as needed, Promethazine DM, and will refill her Protonix 20 mg daily.  No antibiotics at this time as I am unsure what I would be treating she has no evidence of pneumonia or sinusitis.  Discussed with her that this could last several weeks, and she is already on the appropriate medications.  Follow-up with PCP as needed.  ER return precautions given.  Discussed labs, imaging, MDM, treatment plan, and plan for follow-up with patient. Discussed sn/sx that should prompt return to the ED. patient agrees with plan.   Meds ordered this encounter  Medications   pantoprazole (PROTONIX) 20 MG tablet    Sig: Take 1 tablet (20 mg total) by mouth daily.    Dispense:  30 tablet    Refill:  0   Spacer/Aero-Holding Chambers (AEROCHAMBER MV) inhaler    Sig: Use as instructed  Dispense:  1 each    Refill:  1   promethazine-dextromethorphan (PROMETHAZINE-DM) 6.25-15 MG/5ML syrup    Sig: Take 5 mLs by mouth 4 (four) times daily as needed for cough.    Dispense:  118 mL    Refill:  0      *This clinic note was created using [employer]. Therefore, there may be occasional mistakes despite careful proofreading.  ?    Melynda Ripple, MD 09/18/21 [phone]

## 2021-09-18 NOTE — Discharge Instructions (Addendum)
Your chest x-ray was negative for pneumonia.  You not have any evidence of a bacterial infection at this time.  2 puffs from your albuterol inhaler using your spacer every 4 hours for 2 days, then every 6 hours for 2 days, and then as needed.  You can back off on the albuterol if you start to improve sooner Promethazine DM as needed for cough.  You can continue with Tessalon.  Finish the prednisone.  I have also refilled your Protonix.  I decided to not send you home with antibiotics because I am not sure exactly what I would be treating with the antibiotics.  Unfortunately, this can last another week or 2.  Feel better soon.

## 2021-09-18 NOTE — ED Triage Notes (Signed)
Pt reports cough x 3 weeks.  Intermittent HA with cough.  Sometimes productive for clear mucous.  Possible fever in the beginning.  Cough is improved but still has coughing fits and weakness. Is on Prednisone, Tessalon Perles, and inhaler x 1 week after televisit.  Some SOB when walking around.

## 2021-12-09 DIAGNOSIS — Z1231 Encounter for screening mammogram for malignant neoplasm of breast: Secondary | ICD-10-CM | POA: Diagnosis not present

## 2021-12-09 DIAGNOSIS — Z01419 Encounter for gynecological examination (general) (routine) without abnormal findings: Secondary | ICD-10-CM | POA: Diagnosis not present

## 2021-12-09 DIAGNOSIS — Z1211 Encounter for screening for malignant neoplasm of colon: Secondary | ICD-10-CM | POA: Diagnosis not present

## 2021-12-10 DIAGNOSIS — Z13220 Encounter for screening for lipoid disorders: Secondary | ICD-10-CM | POA: Diagnosis not present

## 2021-12-10 DIAGNOSIS — Z01419 Encounter for gynecological examination (general) (routine) without abnormal findings: Secondary | ICD-10-CM | POA: Diagnosis not present

## 2021-12-19 DIAGNOSIS — Z6828 Body mass index (BMI) 28.0-28.9, adult: Secondary | ICD-10-CM | POA: Diagnosis not present

## 2021-12-19 DIAGNOSIS — L259 Unspecified contact dermatitis, unspecified cause: Secondary | ICD-10-CM | POA: Diagnosis not present

## 2021-12-22 DIAGNOSIS — Z1231 Encounter for screening mammogram for malignant neoplasm of breast: Secondary | ICD-10-CM | POA: Diagnosis not present

## 2021-12-22 DIAGNOSIS — Z1239 Encounter for other screening for malignant neoplasm of breast: Secondary | ICD-10-CM | POA: Diagnosis not present

## 2021-12-22 LAB — HM MAMMOGRAPHY

## 2022-01-18 ENCOUNTER — Ambulatory Visit (INDEPENDENT_AMBULATORY_CARE_PROVIDER_SITE_OTHER): Payer: BC Managed Care – PPO | Admitting: Surgery

## 2022-01-18 ENCOUNTER — Telehealth: Payer: Self-pay | Admitting: Surgery

## 2022-01-18 ENCOUNTER — Other Ambulatory Visit: Payer: Self-pay

## 2022-01-18 ENCOUNTER — Encounter: Payer: Self-pay | Admitting: Surgery

## 2022-01-18 VITALS — BP 119/85 | HR 62 | Temp 98.3°F | Ht 63.0 in | Wt 161.0 lb

## 2022-01-18 DIAGNOSIS — K449 Diaphragmatic hernia without obstruction or gangrene: Secondary | ICD-10-CM | POA: Diagnosis not present

## 2022-01-18 NOTE — Telephone Encounter (Signed)
Patient called back and was given surgery information. °

## 2022-01-18 NOTE — Telephone Encounter (Signed)
Outgoing call is made, not able to leave message, voice mailbox full.  Please inform patient of the following regarding scheduled surgery.   Pre-Admission date/time, and Surgery date at [facility].  Surgery Date: 02/11/22 Preadmission Testing Date: 02/03/22 (phone 8a-1p)  Also patient will need to call at [phone], between 1-3:00pm the day before surgery, to find out what time to arrive for surgery.

## 2022-01-18 NOTE — Patient Instructions (Addendum)
We have spoken today about repairing your Hiatal Hernia. Your surgery will be scheduled at [facility] with Dr. Dahlia Byes.  Plan to be in the hospital for 1-2 days if the minimally invasive surgery is completed without having to make a bigger incision. If the bigger incision is made, you will most likely need to be in the hospital 4-6 days. You will be on a liquid diet and need to recover for 2 weeks following your surgery prior to doing any of your normal activities. At the 2 week mark, we will see you in the office and if you are doing ok we will advance your diet and activity level as you tolerate.  Please see your Blue (Pre-care) Sheet for more information regarding your surgery. Our surgery scheduler will call you to verify surgery date and to go over information  You will need to arrange to be out of work for approximately 1-2 weeks and then you may return with a lifting restriction for 4 more weeks. If you have FMLA or Disability paperwork that needs to be filled out, please have your company fax your paperwork to [phone] or you may drop this by either office. This paperwork will be filled out within 3 days after your surgery has been completed.  Please call our office with any questions or concerns that you have regarding your surgery and recovery.    Laparoscopic Nissen Fundoplication Laparoscopic Nissen fundoplication is surgery to relieve heartburn and other problems caused by gastric fluids flowing up into your esophagus. The esophagus is the tube that carries food and liquid from your throat to your stomach. Normally, the muscle that sits between your stomach and your esophagus (lower esophageal sphincter or LES) keeps stomach fluids in your stomach. In some people, the LES does not work properly, and stomach fluids flow up into the esophagus. This can happen when part of the stomach bulges through the LES (hiatal hernia). The backward flow of stomach fluids can cause a type of severe and  long-standing heartburn that is called gastroesophageal reflux disease (GERD). You may need this surgery if other treatments for GERD have not helped. In a laparoscopic Nissen fundoplication, the upper part of your stomach is wrapped around the lower part of your esophagus to strengthen the LES and prevent reflux. If you have a hiatal hernia, it will also be repaired with this surgery. The procedure is done through several small incisions in your abdomen. It is performed using a thin, telescopic instrument (laparoscope) and other instruments that can pass through the scope or through other small incisions. Tell a health care provider about: Any allergies you have. All medicines you are taking, including vitamins, herbs, eye drops, creams, and over-the-counter medicines. Any problems you or family members have had with anesthetic medicines. Any blood disorders you have. Any surgeries you have had. Any medical conditions you have. What are the risks? Generally, this is a safe procedure. However, problems may occur, including: Difficulty swallowing (dysphagia). Bloating. Nausea or vomiting. Damage to the lung, causing a collapsed lung. Infection or bleeding. What happens before the procedure? Ask your health care provider about: Changing or stopping your regular medicines. This is especially important if you are taking diabetes medicines or blood thinners. Taking medicines such as aspirin and ibuprofen. These medicines can thin your blood. Do not take these medicines before your procedure if your health care provider asks you not to. Follow your health care provider's instructions about eating or drinking restrictions. Plan to have someone take you  home after the procedure. What happens during the procedure? An IV tube will be inserted into one of your veins. It will be used to give you fluids and medicines during the procedure. You will be given a medicine that makes you fall asleep (general  anesthetic). Your abdomen will be cleaned with a germ-killing solution (antiseptic). The surgeon will make a small incision in your abdomen and insert a tube through the incision. Your abdomen will be filled with a gas. This helps the surgeon to see your organs more easily and it makes more space to work. The surgeon will insert the laparoscope through the incision. The scope has a camera that will send pictures to a monitor in the operating room. The surgeon will make several other small incisions in your abdomen to insert the other instruments that are needed during the procedure. Another instrument (dilator) will be passed through your mouth and down your esophagus into the upper part of your stomach. The dilator will prevent your LES from being closed too tightly during surgery. The surgeon will pass the top portion of your stomach behind the lower part of your esophagus and wrap it all the way around. This will be stitched into place. If you have a hiatal hernia, it will be repaired during this procedure. All instruments will be removed, and your incisions will be closed under your skin with stitches (sutures). Skin adhesive strips may also be used. A bandage (dressing) will be placed on your skin over the incisions. The procedure may vary among health care providers and hospitals. What happens after the procedure? You will be moved to a recovery area. Your blood pressure, heart rate, breathing rate, and blood oxygen level will be monitored often until the medicines you were given have worn off. You will be given pain medicine as needed. Your IV tube will be kept in until you are able to drink fluids. This information is not intended to replace advice given to you by your health care provider. Make sure you discuss any questions you have with your health care provider. Document Released: 04/12/2014 Document Revised: 08/28/2015 Document Reviewed: 11/21/2013 Elsevier Interactive Patient  Education  2017 [employer]   Laparoscopic Nissen Fundoplication, Care After Refer to this sheet in the next few weeks. These instructions provide you with information about caring for yourself after your procedure. Your health care provider may also give you more specific instructions. Your treatment has been planned according to current medical practices, but problems sometimes occur. Call your health care provider if you have any problems or questions after your procedure. What can I expect after the procedure? After the procedure, it is common to have: Difficulty swallowing (dysphagia). Excess gas (bloating). Follow these instructions at home: Medicines  Take medicines only as directed by your health care provider. Do not drive or operate heavy machinery while taking pain medicine. Incision care  There are many different ways to close and cover an incision, including stitches (sutures), skin glue, and adhesive strips. Follow your health care provider's instructions about: Incision care. Bandage (dressing) changes and removal. Incision closure removal. Check your incision areas every day for signs of infection. Watch for: Redness, swelling, or pain. Fluid, blood, or pus. Do not take baths, swim, or use a hot tub until your health care provider approves. Take showers as directed by your health care provider. Eating and drinking  Follow your health care provider's instructions about eating. You may need to follow a liquid-only diet for 2 weeks, followed  by a diet of soft foods for 2 weeks. You should return to your usual diet gradually. Drink enough fluid to keep your urine clear or pale yellow. Activity  Return to your normal activities as directed by your health care provider. Ask your health care provider what activities are safe for you. Avoid strenuous exercise. Do not lift anything that is heavier than 10 lb (4.5 kg). Ask your health care provider when you can: Return to  sexual activity. Drive. Go back to work. Contact a health care provider if: You have a fever. Your pain gets worse or is not helped by medicine. You have frequent nausea or vomiting. You have continued abdominal bloating. You have an ongoing (persistent) cough. You have redness, swelling, or pain in any incision areas. You have fluid, blood, or pus coming from any incisions. Get help right away if: You have trouble breathing. You are unable to swallow. You have persistent vomiting. You have blood in your vomit. You have severe abdominal pain. This information is not intended to replace advice given to you by your health care provider. Make sure you discuss any questions you have with your health care provider. Document Released: 11/13/2003 Document Revised: 08/28/2015 Document Reviewed: 11/21/2013 Elsevier Interactive Patient Education  2017 [place].   Diet After Nissen Fundoplication Surgery This diet information is for patients who have recently had Nissen fundoplication surgery to correct reflux disease or to repair various types of hernias, such as hiatal hernia and intrathoracic stomach. This diet may also be used for other gastrointestinal surgeries, such as Heller myotomy and repair of achalasia. The diet will help control diarrhea, excess gas and swallowing problems, which may occur after this type of surgery. Keeping Your Stomach from Stretching Eat small, frequent meals (six to eight per day). This will help you consume the majority of the nutrients you need without causing your stomach to feel full or distended.  Drinking large amounts of fluids with meals can stretch your stomach. You may drink fluids between meals as often as you like, but limit fluids to 1/2 cup (4 fluid ounces) with meals and one cup (8 fluid ounces) with snacks.  Sit upright while eating and stay upright for 30 minutes after each meal. Gravity can help food move through your digestive tract. Do not  lie down after eating. Sit upright for 2 hours after your last meal or snack of the day.  Eat very slowly. Take your time when eating.  Take small bites and chew your food well to help aid in swallowing and digestion.  Avoid crusty breads and sticky, gummy foods, such as bananas, fresh doughy breads, rolls and doughnuts. These types of foods become sticky and difficult to swallow.  Toasted breads tend to be better tolerated.  Lastly, if you eat sweets, consume them at the end of your meal to avoid a group of symptoms referred to as "dumping syndrome". This describes the rapid emptying of foods from the stomach to the small intestine. Sweetened beverages, candy and desserts move more rapidly and dump quickly into the intestines. This can cause symptoms of nausea, weakness, cold sweats, cramps, diarrhea and dizzy spells.  Avoiding Gas Avoid drinking through a straw. Do not chew gum or tobacco. These actions cause you to swallow air, which produces excess gas in your stomach. Chew with your mouth closed.  Avoid any foods that cause stomach gas and distention. These foods include corn, dried beans, peas, lentils, onions, broccoli, cauliflower and any food from the  cabbage family.  Avoid carbonated drinks, alcohol, citrus and tomato products.  When will I be able to eat a soft diet? After Nissen fundoplication surgery, your diet will be advanced slowly by your surgeon. Generally, you will be on a clear liquid diet for the first few meals. Then you will advance to the full liquid diet for a meal or two and eventually to a Nissen soft diet. Please be aware that each patient's tolerance to food is different. Your doctor will advance your diet depending on how well you progress after surgery. Clear Liquid Diet  The first diet after surgery is the clear liquid diet. It includes the following liquids: Apple juice  Cranberry juice  Grape juice  Chicken broth  Beef broth  Flavored gelatin (Jell-O)  Decaf  tea and coffee  Caffeinated beverages are permitted based on tolerance  Popsicles  [place] ice Carbonated drinks (sodas) are not allowed for the first six to eight weeks after surgery. After this time you can try them again in small amounts.  Full Liquid Diet The full liquid diet contains anything on the clear liquid diet, plus: Milk, soy, rice and almond (no chocolate)  Cream of wheat, cream of rice, grits  Strained creamed soups (no tomato or broccoli)  Vanilla and strawberry-flavored ice cream  Sherbet  Blended, custard styled or whipped yogurt (plain or vanilla only)  Vanilla and butterscotch pudding (no chocolate or coconut)  Nutritional drinks including Ensure, Boost, Carnation Instant Breakfast (no chocolate-flavored) Note: Dairy products, such as milk, ice cream and pudding, may cause diarrhea in some people just after surgery. You may need to avoid milk products. If so, substitute them with lactose-free beverages, such as soy, rice, Lactaid or almond milks.  Nissen Soft Diet Food Category Foods to Choose Foods to Avoid  Beverages Milk, such as, whole, 2%, 1%, non-fat, or skim, soy, rice, almond  Caffeinated and decaf tea and coffee  Powdered drink mixes (in moderation)  Non-citrus juices (apple, grape, cranberry or blends of these)  Fruit nectars  Nutritional drinks including Boost, Ensure, Carnation Instant Breakfast Chocolate milk, cocoa or other chocolate-flavored drinks  Carbonated drinks  Alcohol  Citrus juices like orange, grapefruit, lemon and lime  Breads Pancakes, [place] toast and waffles  Crackers (saltine, butter, soda, graham, Goldfish and Cheese Nips)  Toasted bread Untoasted bread, bagels, [place] and hard rolls, English muffins  Crackers with nuts, seeds, fresh or dried fruit, coconut, or highly seasoned, such as garlic or onion-flavored  Sweet rolls, coffee cake or doughnuts  Cereals Well cooked cereals, such as oatmeal (plain or flavored)  Cold  cereal (Cornflakes, Rice Krispies, Cheerios, Special K plain, Rice Chex and puffed rice) Very coarse cereal, such as bran, shredded wheat  Any cereal with fresh or dried fruit, coconut, seeds or nuts  Desserts Eat in moderation and do not eat desserts or sweets by themselves. Plain cakes, cookies and cream-filled pies  Vanilla and butterscotch pudding or custard  Ice cream, ice milk, frozen yogurt and sherbet  Gelatin made from allowed foods  Fruit ices and popsicles Desserts containing chocolate, coconut, nuts, seeds, fresh or dried fruit, peppermint or spearmint  Eggs  Poached, hard boiled or scrambled Fried eggs and highly seasoned eggs (deviled eggs)  Fats Eat in moderation. Butter and margarine  Mayonnaise and vegetable oils  Mildly seasoned cream sauces and gravies  Plain cream cheese  Sour cream Highly seasoned salad dressings, cream sauces and gravies  Bacon, bacon fat, ham fat, lard and salt pork  Fried foods  Nuts  Fruits Fruit juice  Any canned or cooked fruit except those listed in the AVOID column ALL fresh fruits, such as citrus, bananas and pineapple  Canned pineapple  Dried fruits, such as raisins, berries  Fruits with seeds, such as berries, kiwi and figs  Meat, Fish, Poultry, and [employer] may be ground, minced or chopped to ease swallowing and digestion  Tender, well cooked and moist cuts of beef, chicken, [place] and pork  Veal and lamb  Flaky, cooked fish  Canned tuna  Cottage and ricotta cheeses  Mild cheese, such as American, brick, mozzarella and baby Swiss  Creamy peanut butter  Plain custard or blended fruit yogurt  Moist casseroles, such as macaroni & cheese, tuna noodle  Grilled or toasted cheese sandwich Tough meats with a lot of gristle  Fried, highly seasoned, smoked and fatty meat, fish or poultry, such as frankfurters, luncheon meats, sausage, bacon, spare ribs, beef brisket, sardines, anchovies, duck and goose  Chili and other  entrees made with pepper or chili pepper  Shellfish  Strongly flavored cheeses, such as sharp cheese, extra sharp cheddar, cheese containing peppers or other seasonings  Crunchy peanut butter  Any yogurt with nuts, seeds, coconut, strawberries or raspberries  Potatoes and Starches Peeled, mashed or boiled white or sweet potatoes  Oven-baked potatoes without skin  Well cooked white rice, enriched noodles, barley, spaghetti, macaroni and other pastas Fried potatoes, potato skins and potato chips  Hard and soft taco shells  Fried, brown or wild rice  Soups Mildly flavored meat stocks  Cream soups made from allowed foods Highly seasoned soups and tomato based soups, cream soups made with gas producing vegetables, such as broccoli, cauliflower, onion, etc.  Sweets and Snacks Use in moderation and do not eat large amounts of sweets by themselves. Syrup, honey, jelly and seedless jam  Plain hard candies and plain candies made with allowed ingredients  Molasses  Marshmallows  Other candy made from allowed ingredients  Thin pretzels Jam, marmalade and preserves  Chocolate in any form  Any candy containing nuts, coconut, seeds, peppermint, spearmint or dried or fresh fruit  Popcorn, potato chips, tortilla chips  Soft or hard thick pretzels, such as sourdough  Vegetables Well cooked soft vegetables without seeds or skins, such as asparagus tips, beets, carrots, green and wax beans, chopped spinach, tender canned baby peas, squash and pumpkin Raw vegetables, tomatoes, tomato juice, tomato sauce and V-8 juice  Gas producing vegetables, such as broccoli, Brussel sprouts, cabbage, cauliflower, onions, corn, cucumber, green peppers, rutabagas, turnips, radishes and sauerkraut  Dried beans, peas and lentils  Miscellaneous Salt and spices in moderation  Mustard and vinegar in moderation Fried or highly seasoned foods  Coconut and seeds  Pickles and olives  Chili sauces, ketchup, barbecue sauce,  horseradish, black pepper, chili powder and onion and garlic seasonings  Any other strongly flavored seasoning, condiment, spice or herb not tolerated  Any food not tolerated

## 2022-01-20 NOTE — Progress Notes (Signed)
Outpatient Surgical Follow Up  01/20/2022  Kristen Macdonald is an 51 y.o. female.   Chief Complaint  Patient presents with   Follow-up    Hiatal hernia    HPI: This is a 51 year old following him for paraesophageal hernia.  She underwent a CT scan of the abdomen pelvis as well as a barium swallow.  I have personally reviewed the images showing evidence of a type III paraesophageal hernia moderate in size.  I have also personally reviewed the EGD performed on April 20, 2019 by Dr. Alice Macdonald.  She did have a widely patent Schatzki ring and reflux.  There was also confirmation of a hiatal hernia.  Continues to use PPI and over-the-counter antiacids without relief of her reflux. She is now ready to have paraesophageal hernia repair with antireflux surgery.  Past Medical History:  Diagnosis Date   GERD (gastroesophageal reflux disease)    Hyperlipidemia    Hypertension     Past Surgical History:  Procedure Laterality Date   TONSILLECTOMY      Family History  Problem Relation Age of Onset   Breast cancer Mother    Hypertension Mother    Anxiety disorder Mother    Heart disease Father     Social History:  reports that she has never smoked. She has never used smokeless tobacco. She reports current alcohol use. She reports that she does not use drugs.  Allergies:  Allergies  Allergen Reactions   Codeine Nausea Only    Medications reviewed.    ROS Full ROS performed and is otherwise negative other than what is stated in HPI   BP 119/85   Pulse 62   Temp 98.3 F (36.8 C) (Oral)   Ht '5\' 3"'$  (1.6 m)   Wt 161 lb (73 kg)   SpO2 100%   BMI 28.52 kg/m   Physical Exam Vitals and nursing note reviewed. Exam conducted with a chaperone present.  Constitutional:      General: She is not in acute distress.    Appearance: Normal appearance. She is normal weight.  Eyes:     General: No scleral icterus.       Right eye: No discharge.        Left eye: No discharge.      Extraocular Movements: Extraocular movements intact.  Cardiovascular:     Rate and Rhythm: Normal rate and regular rhythm.  Pulmonary:     Effort: Pulmonary effort is normal. No respiratory distress.     Breath sounds: Normal breath sounds. No stridor.  Abdominal:     General: Abdomen is flat. There is no distension.     Palpations: Abdomen is soft. There is no mass.     Tenderness: There is no abdominal tenderness. There is no guarding or rebound.     Hernia: No hernia is present.  Musculoskeletal:     Cervical back: Normal range of motion and neck supple. No rigidity or tenderness.  Lymphadenopathy:     Cervical: No cervical adenopathy.  Skin:    General: Skin is warm and dry.     Capillary Refill: Capillary refill takes less than 2 seconds.  Neurological:     General: No focal deficit present.     Mental Status: She is alert and oriented to person, place, and time.  Psychiatric:        Mood and Affect: Mood normal.        Behavior: Behavior normal.        Thought Content: Thought content  normal.        Judgment: Judgment normal.     Assessment/Plan: 51 year old female with paraesophageal hernia type 3.  Patient has symptoms only if she comes off PPIs.  An extensive discussion with the patient regarding her disease process was held.  He is ready to proceed with surgical intervention.  I did discuss the postoperative course and the detail of the potential surgery.  I do think that she will be a good candidate for robotic approach if she decides to move forward with the repair.  I encouraged her to think about it and talk to her family. She discussed with her in detail.  Risks, benefits and plus complications including but not limited to: Bleeding, infection, bowel injuries, esophageal injuries, and thorax: Chronic bloating, chronic pain.  Presents and wished to proceed We will schedule for robotic paraesophageal hernia repair with Nissen fundoplication  I spent Greater than   40  minutes in this encounter including counseling/coordination of care, patient orders, reviewing imaging studies and performing appropriate documentation Caroleen Hamman, MD [place] Surgeon

## 2022-01-20 NOTE — H&P (View-Only) (Signed)
Outpatient Surgical Follow Up  01/20/2022  Kristen Macdonald is an 51 y.o. female.   Chief Complaint  Patient presents with   Follow-up    Hiatal hernia    HPI: This is a 51 year old following him for paraesophageal hernia.  She underwent a CT scan of the abdomen pelvis as well as a barium swallow.  I have personally reviewed the images showing evidence of a type III paraesophageal hernia moderate in size.  I have also personally reviewed the EGD performed on April 20, 2019 by Dr. Alice Reichert.  She did have a widely patent Schatzki ring and reflux.  There was also confirmation of a hiatal hernia.  Continues to use PPI and over-the-counter antiacids without relief of her reflux. She is now ready to have paraesophageal hernia repair with antireflux surgery.  Past Medical History:  Diagnosis Date   GERD (gastroesophageal reflux disease)    Hyperlipidemia    Hypertension     Past Surgical History:  Procedure Laterality Date   TONSILLECTOMY      Family History  Problem Relation Age of Onset   Breast cancer Mother    Hypertension Mother    Anxiety disorder Mother    Heart disease Father     Social History:  reports that she has never smoked. She has never used smokeless tobacco. She reports current alcohol use. She reports that she does not use drugs.  Allergies:  Allergies  Allergen Reactions   Codeine Nausea Only    Medications reviewed.    ROS Full ROS performed and is otherwise negative other than what is stated in HPI   BP 119/85   Pulse 62   Temp 98.3 F (36.8 C) (Oral)   Ht '5\' 3"'$  (1.6 m)   Wt 161 lb (73 kg)   SpO2 100%   BMI 28.52 kg/m   Physical Exam Vitals and nursing note reviewed. Exam conducted with a chaperone present.  Constitutional:      General: She is not in acute distress.    Appearance: Normal appearance. She is normal weight.  Eyes:     General: No scleral icterus.       Right eye: No discharge.        Left eye: No discharge.      Extraocular Movements: Extraocular movements intact.  Cardiovascular:     Rate and Rhythm: Normal rate and regular rhythm.  Pulmonary:     Effort: Pulmonary effort is normal. No respiratory distress.     Breath sounds: Normal breath sounds. No stridor.  Abdominal:     General: Abdomen is flat. There is no distension.     Palpations: Abdomen is soft. There is no mass.     Tenderness: There is no abdominal tenderness. There is no guarding or rebound.     Hernia: No hernia is present.  Musculoskeletal:     Cervical back: Normal range of motion and neck supple. No rigidity or tenderness.  Lymphadenopathy:     Cervical: No cervical adenopathy.  Skin:    General: Skin is warm and dry.     Capillary Refill: Capillary refill takes less than 2 seconds.  Neurological:     General: No focal deficit present.     Mental Status: She is alert and oriented to person, place, and time.  Psychiatric:        Mood and Affect: Mood normal.        Behavior: Behavior normal.        Thought Content: Thought content  normal.        Judgment: Judgment normal.     Assessment/Plan: 51 year old female with paraesophageal hernia type 3.  Patient has symptoms only if she comes off PPIs.  An extensive discussion with the patient regarding her disease process was held.  He is ready to proceed with surgical intervention.  I did discuss the postoperative course and the detail of the potential surgery.  I do think that she will be a good candidate for robotic approach if she decides to move forward with the repair.  I encouraged her to think about it and talk to her family. She discussed with her in detail.  Risks, benefits and plus complications including but not limited to: Bleeding, infection, bowel injuries, esophageal injuries, and thorax: Chronic bloating, chronic pain.  Presents and wished to proceed We will schedule for robotic paraesophageal hernia repair with Nissen fundoplication  I spent Greater than   40  minutes in this encounter including counseling/coordination of care, patient orders, reviewing imaging studies and performing appropriate documentation Caroleen Hamman, MD [place] Surgeon

## 2022-02-03 ENCOUNTER — Encounter
Admission: RE | Admit: 2022-02-03 | Discharge: 2022-02-03 | Disposition: A | Discharge status: Home / Self Care | Payer: BC Managed Care – PPO | Source: Ambulatory Visit | Attending: Surgery | Admitting: Surgery

## 2022-02-03 ENCOUNTER — Other Ambulatory Visit: Payer: Self-pay

## 2022-02-03 DIAGNOSIS — Z01812 Encounter for preprocedural laboratory examination: Secondary | ICD-10-CM

## 2022-02-03 NOTE — Patient Instructions (Addendum)
Your procedure is scheduled on: 02/11/22 - Thursday Report to the Registration Desk on the 1st floor of the [facility]. To find out your arrival time, please call [phone] between 1PM - 3PM on: 02/10/22 - Wednesday If your arrival time is 6:00 am, do not arrive prior to that time as the [facility] entrance doors do not open until 6:00 am.  REMEMBER: Instructions that are not followed completely may result in serious medical risk, up to and including death; or upon the discretion of your surgeon and anesthesiologist your surgery may need to be rescheduled.  Do not eat food after midnight the night before surgery.  No gum chewing, lozengers or hard candies.  TAKE THESE MEDICATIONS THE MORNING OF SURGERY WITH A SIP OF WATER: NONE  One week prior to surgery: Stop Anti-inflammatories (NSAIDS) such as Advil, Aleve, Ibuprofen, Motrin, Naproxen, Naprosyn and Aspirin based products such as Excedrin, Goodys Powder, BC Powder.  Stop ANY OVER THE COUNTER supplements until after surgery.  You may however, continue to take Tylenol if needed for pain up until the day of surgery.  No Alcohol for 24 hours before or after surgery.  No Smoking including e-cigarettes for 24 hours prior to surgery.  No chewable tobacco products for at least 6 hours prior to surgery.  No nicotine patches on the day of surgery.  Do not use any "recreational" drugs for at least a week prior to your surgery.  Please be advised that the combination of cocaine and anesthesia may have negative outcomes, up to and including death. If you test positive for cocaine, your surgery will be cancelled.  On the morning of surgery brush your teeth with toothpaste and water, you may rinse your mouth with mouthwash if you wish. Do not swallow any toothpaste or mouthwash.  Use CHG Soap or wipes as directed on instruction sheet.  Do not wear jewelry, make-up, hairpins, clips or nail polish.  Do not wear lotions, powders, or  perfumes.   Do not shave body from the neck down 48 hours prior to surgery just in case you cut yourself which could leave a site for infection.  Also, freshly shaved skin may become irritated if using the CHG soap.  Contact lenses, hearing aids and dentures may not be worn into surgery.  Do not bring valuables to the hospital. [facility] is not responsible for any missing/lost belongings or valuables.   Notify your doctor if there is any change in your medical condition (cold, fever, infection).  Wear comfortable clothing (specific to your surgery type) to the hospital.  After surgery, you can help prevent lung complications by doing breathing exercises.  Take deep breaths and cough every 1-2 hours. Your doctor may order a device called an Incentive Spirometer to help you take deep breaths. When coughing or sneezing, hold a pillow firmly against your incision with both hands. This is called "splinting." Doing this helps protect your incision. It also decreases belly discomfort.  If you are being admitted to the hospital overnight, leave your suitcase in the car. After surgery it may be brought to your room.  If you are being discharged the day of surgery, you will not be allowed to drive home. You will need a responsible adult (18 years or older) to drive you home and stay with you that night.   If you are taking public transportation, you will need to have a responsible adult (18 years or older) with you. Please confirm with your physician that  it is acceptable to use public transportation.   Please call the [facility] at [phone] if you have any questions about these instructions.  Surgery Visitation Policy:  Patients undergoing a surgery or procedure may have two family members or support persons with them as long as the person is not COVID-19 positive or experiencing its symptoms.   Inpatient Visitation:    Visiting hours are 7 a.m. to 8 p.m. Up to  four visitors are allowed at one time in a patient room, including children. The visitors may rotate out with other people during the day. One designated support person (adult) may remain overnight.

## 2022-02-08 ENCOUNTER — Encounter
Admission: RE | Admit: 2022-02-08 | Discharge: 2022-02-08 | Disposition: A | Discharge status: Home / Self Care | Payer: BC Managed Care – PPO | Source: Ambulatory Visit | Attending: Surgery | Admitting: Surgery

## 2022-02-08 ENCOUNTER — Encounter: Payer: Self-pay | Admitting: Urgent Care

## 2022-02-08 DIAGNOSIS — Z01818 Encounter for other preprocedural examination: Secondary | ICD-10-CM | POA: Diagnosis not present

## 2022-02-08 DIAGNOSIS — Z01812 Encounter for preprocedural laboratory examination: Secondary | ICD-10-CM

## 2022-02-08 DIAGNOSIS — R9431 Abnormal electrocardiogram [ECG] [EKG]: Secondary | ICD-10-CM | POA: Diagnosis not present

## 2022-02-08 DIAGNOSIS — Z01810 Encounter for preprocedural cardiovascular examination: Secondary | ICD-10-CM | POA: Diagnosis not present

## 2022-02-08 LAB — CBC
HCT: 32 % — ABNORMAL LOW (ref 36.0–46.0)
Hemoglobin: 9.6 g/dL — ABNORMAL LOW (ref 12.0–15.0)
MCH: 22.2 pg — ABNORMAL LOW (ref 26.0–34.0)
MCHC: 30 g/dL (ref 30.0–36.0)
MCV: 74.1 fL — ABNORMAL LOW (ref 80.0–100.0)
Platelets: 212 10*3/uL (ref 150–400)
RBC: 4.32 MIL/uL (ref 3.87–5.11)
RDW: 15.9 % — ABNORMAL HIGH (ref 11.5–15.5)
WBC: 5.2 10*3/uL (ref 4.0–10.5)
nRBC: 0 % (ref 0.0–0.2)

## 2022-02-08 LAB — BASIC METABOLIC PANEL
Anion gap: 7 (ref 5–15)
BUN: 12 mg/dL (ref 6–20)
CO2: 24 mmol/L (ref 22–32)
Calcium: 9.4 mg/dL (ref 8.9–10.3)
Chloride: 112 mmol/L — ABNORMAL HIGH (ref 98–111)
Creatinine, Ser: 0.76 mg/dL (ref 0.44–1.00)
GFR, Estimated: >60 mL/min (ref >60)
Glucose, Bld: 99 mg/dL (ref 70–99)
Potassium: 4 mmol/L (ref 3.5–5.1)
Sodium: 143 mmol/L (ref 135–145)

## 2022-02-10 MED ORDER — CHLORHEXIDINE GLUCONATE CLOTH 2 % EX PADS
6.0000 | MEDICATED_PAD | Freq: Once | CUTANEOUS | Status: AC
  Administered 2022-02-11: 6 via TOPICAL

## 2022-02-10 MED ORDER — ORAL CARE MOUTH RINSE: 15.0000 mL | Freq: Once | OROMUCOSAL | Status: AC

## 2022-02-10 MED ORDER — CHLORHEXIDINE GLUCONATE 0.12 % MT SOLN: 15.0000 mL | Freq: Once | OROMUCOSAL | Status: AC

## 2022-02-10 MED ORDER — GABAPENTIN 300 MG PO CAPS: 300.0000 mg | ORAL_CAPSULE | ORAL | Status: AC

## 2022-02-10 MED ORDER — CEFAZOLIN SODIUM-DEXTROSE 2-4 GM/100ML-% IV SOLN
2.0000 g | INTRAVENOUS | Status: AC
  Administered 2022-02-11: 2 g via INTRAVENOUS

## 2022-02-10 MED ORDER — LACTATED RINGERS IV SOLN: INTRAVENOUS | Status: DC

## 2022-02-10 MED ORDER — FAMOTIDINE 20 MG PO TABS: 20.0000 mg | ORAL_TABLET | Freq: Once | ORAL | Status: AC

## 2022-02-10 MED ORDER — ACETAMINOPHEN 500 MG PO TABS: 1000.0000 mg | ORAL_TABLET | ORAL | Status: AC

## 2022-02-10 MED ORDER — CHLORHEXIDINE GLUCONATE CLOTH 2 % EX PADS
6.0000 | MEDICATED_PAD | Freq: Once | CUTANEOUS | Status: AC
  Administered 2022-02-10: 6 via TOPICAL

## 2022-02-10 MED ORDER — CELECOXIB 200 MG PO CAPS: 200.0000 mg | ORAL_CAPSULE | ORAL | Status: AC

## 2022-02-11 ENCOUNTER — Observation Stay
Admission: RE | Admit: 2022-02-11 | Discharge: 2022-02-12 | Disposition: A | Discharge status: Home / Self Care | Payer: BC Managed Care – PPO | Attending: Surgery | Admitting: Surgery

## 2022-02-11 ENCOUNTER — Other Ambulatory Visit: Payer: Self-pay

## 2022-02-11 ENCOUNTER — Encounter: Payer: Self-pay | Admitting: Surgery

## 2022-02-11 ENCOUNTER — Encounter
Admission: RE | Disposition: A | Discharge status: Home / Self Care | Payer: Self-pay | Source: Home / Self Care | Attending: Surgery

## 2022-02-11 ENCOUNTER — Ambulatory Visit: Payer: BC Managed Care – PPO | Admitting: Anesthesiology

## 2022-02-11 DIAGNOSIS — K440 Diaphragmatic hernia with obstruction, without gangrene: Principal | ICD-10-CM | POA: Insufficient documentation

## 2022-02-11 DIAGNOSIS — Z8719 Personal history of other diseases of the digestive system: Secondary | ICD-10-CM

## 2022-02-11 DIAGNOSIS — D649 Anemia, unspecified: Secondary | ICD-10-CM

## 2022-02-11 DIAGNOSIS — I10 Essential (primary) hypertension: Secondary | ICD-10-CM | POA: Insufficient documentation

## 2022-02-11 DIAGNOSIS — K219 Gastro-esophageal reflux disease without esophagitis: Secondary | ICD-10-CM | POA: Diagnosis not present

## 2022-02-11 DIAGNOSIS — K449 Diaphragmatic hernia without obstruction or gangrene: Secondary | ICD-10-CM

## 2022-02-11 DIAGNOSIS — F419 Anxiety disorder, unspecified: Secondary | ICD-10-CM | POA: Diagnosis not present

## 2022-02-11 DIAGNOSIS — R1310 Dysphagia, unspecified: Secondary | ICD-10-CM | POA: Diagnosis not present

## 2022-02-11 DIAGNOSIS — E785 Hyperlipidemia, unspecified: Secondary | ICD-10-CM | POA: Diagnosis not present

## 2022-02-11 HISTORY — PX: INSERTION OF MESH: SHX5868

## 2022-02-11 HISTORY — PX: XI ROBOTIC ASSISTED PARAESOPHAGEAL HERNIA REPAIR: SHX6871

## 2022-02-11 LAB — CBC
HCT: 31.5 % — ABNORMAL LOW (ref 36.0–46.0)
Hemoglobin: 9.5 g/dL — ABNORMAL LOW (ref 12.0–15.0)
MCH: 22.6 pg — ABNORMAL LOW (ref 26.0–34.0)
MCHC: 30.2 g/dL (ref 30.0–36.0)
MCV: 74.8 fL — ABNORMAL LOW (ref 80.0–100.0)
Platelets: 260 10*3/uL (ref 150–400)
RBC: 4.21 MIL/uL (ref 3.87–5.11)
RDW: 15.9 % — ABNORMAL HIGH (ref 11.5–15.5)
WBC: 9.5 10*3/uL (ref 4.0–10.5)
nRBC: 0 % (ref 0.0–0.2)

## 2022-02-11 LAB — TYPE AND SCREEN
ABO/RH(D): O POS
Antibody Screen: NEGATIVE

## 2022-02-11 LAB — CREATININE, SERUM
Creatinine, Ser: 0.87 mg/dL (ref 0.44–1.00)
GFR, Estimated: >60 mL/min (ref >60)

## 2022-02-11 SURGERY — REPAIR, HERNIA, PARAESOPHAGEAL, ROBOT-ASSISTED
Anesthesia: General

## 2022-02-11 MED ORDER — KETOROLAC TROMETHAMINE 15 MG/ML IJ SOLN
INTRAMUSCULAR | Status: AC
  Filled 2022-02-11: qty 1

## 2022-02-11 MED ORDER — MIDAZOLAM HCL 2 MG/2ML IJ SOLN
INTRAMUSCULAR | Status: DC | PRN
  Administered 2022-02-11: 2 mg via INTRAVENOUS

## 2022-02-11 MED ORDER — BUPIVACAINE LIPOSOME 1.3 % IJ SUSP
INTRAMUSCULAR | Status: AC
  Filled 2022-02-11: qty 20

## 2022-02-11 MED ORDER — FENTANYL CITRATE (PF) 100 MCG/2ML IJ SOLN
INTRAMUSCULAR | Status: DC | PRN
  Administered 2022-02-11 (×3): 50 ug via INTRAVENOUS

## 2022-02-11 MED ORDER — ROCURONIUM BROMIDE 10 MG/ML (PF) SYRINGE
PREFILLED_SYRINGE | INTRAVENOUS | Status: AC
  Filled 2022-02-11: qty 10

## 2022-02-11 MED ORDER — ONDANSETRON HCL 4 MG/2ML IJ SOLN
INTRAMUSCULAR | Status: AC
  Filled 2022-02-11: qty 2

## 2022-02-11 MED ORDER — DEXAMETHASONE SODIUM PHOSPHATE 10 MG/ML IJ SOLN
INTRAMUSCULAR | Status: DC | PRN
  Administered 2022-02-11: 10 mg via INTRAVENOUS

## 2022-02-11 MED ORDER — DEXAMETHASONE SODIUM PHOSPHATE 10 MG/ML IJ SOLN
INTRAMUSCULAR | Status: AC
  Filled 2022-02-11: qty 1

## 2022-02-11 MED ORDER — VISTASEAL 10 ML SINGLE DOSE KIT
PACK | CUTANEOUS | Status: AC
  Filled 2022-02-11: qty 10

## 2022-02-11 MED ORDER — ONDANSETRON HCL 4 MG/2ML IJ SOLN
INTRAMUSCULAR | Status: DC | PRN
  Administered 2022-02-11: 4 mg via INTRAVENOUS

## 2022-02-11 MED ORDER — ONDANSETRON HCL 4 MG/2ML IJ SOLN: 4.0000 mg | Freq: Four times a day (QID) | INTRAMUSCULAR | Status: DC | PRN

## 2022-02-11 MED ORDER — KETOROLAC TROMETHAMINE 15 MG/ML IJ SOLN
INTRAMUSCULAR | Status: AC
  Administered 2022-02-11: 15 mg via INTRAVENOUS
  Filled 2022-02-11: qty 1

## 2022-02-11 MED ORDER — MELATONIN 5 MG PO TABS: 5.0000 mg | ORAL_TABLET | Freq: Every evening | ORAL | Status: DC | PRN

## 2022-02-11 MED ORDER — BUPIVACAINE LIPOSOME 1.3 % IJ SUSP
INTRAMUSCULAR | Status: DC | PRN
  Administered 2022-02-11: 20 mL

## 2022-02-11 MED ORDER — PROPOFOL 10 MG/ML IV BOLUS
INTRAVENOUS | Status: DC | PRN
  Administered 2022-02-11: 150 mg via INTRAVENOUS

## 2022-02-11 MED ORDER — PREGABALIN 50 MG PO CAPS
50.0000 mg | ORAL_CAPSULE | Freq: Three times a day (TID) | ORAL | Status: DC
  Administered 2022-02-11 – 2022-02-12 (×2): 50 mg via ORAL
  Filled 2022-02-11 (×2): qty 1

## 2022-02-11 MED ORDER — ACETAMINOPHEN 500 MG PO TABS
ORAL_TABLET | ORAL | Status: AC
  Administered 2022-02-11: 1000 mg via ORAL
  Filled 2022-02-11: qty 2

## 2022-02-11 MED ORDER — PROMETHAZINE HCL 25 MG/ML IJ SOLN: 6.2500 mg | INTRAMUSCULAR | Status: DC | PRN

## 2022-02-11 MED ORDER — MORPHINE SULFATE (PF) 2 MG/ML IV SOLN: 2.0000 mg | INTRAVENOUS | Status: DC | PRN

## 2022-02-11 MED ORDER — GABAPENTIN 300 MG PO CAPS
ORAL_CAPSULE | ORAL | Status: AC
  Administered 2022-02-11: 300 mg via ORAL
  Filled 2022-02-11: qty 1

## 2022-02-11 MED ORDER — FENTANYL CITRATE (PF) 100 MCG/2ML IJ SOLN
INTRAMUSCULAR | Status: AC
  Filled 2022-02-11: qty 2

## 2022-02-11 MED ORDER — PROCHLORPERAZINE MALEATE 10 MG PO TABS: 10.0000 mg | ORAL_TABLET | Freq: Four times a day (QID) | ORAL | Status: DC | PRN

## 2022-02-11 MED ORDER — DROPERIDOL 2.5 MG/ML IJ SOLN: 0.6250 mg | Freq: Once | INTRAMUSCULAR | Status: DC | PRN

## 2022-02-11 MED ORDER — MIDAZOLAM HCL 2 MG/2ML IJ SOLN
INTRAMUSCULAR | Status: AC
  Filled 2022-02-11: qty 2

## 2022-02-11 MED ORDER — ONDANSETRON 4 MG PO TBDP: 4.0000 mg | ORAL_TABLET | Freq: Four times a day (QID) | ORAL | Status: DC | PRN

## 2022-02-11 MED ORDER — DEXMEDETOMIDINE HCL IN NACL 80 MCG/20ML IV SOLN
INTRAVENOUS | Status: AC
  Filled 2022-02-11: qty 20

## 2022-02-11 MED ORDER — SUGAMMADEX SODIUM 200 MG/2ML IV SOLN
INTRAVENOUS | Status: DC | PRN
  Administered 2022-02-11: 200 mg via INTRAVENOUS

## 2022-02-11 MED ORDER — BUPIVACAINE-EPINEPHRINE (PF) 0.25% -1:200000 IJ SOLN
INTRAMUSCULAR | Status: AC
  Filled 2022-02-11: qty 30

## 2022-02-11 MED ORDER — CEFAZOLIN SODIUM-DEXTROSE 2-4 GM/100ML-% IV SOLN
INTRAVENOUS | Status: AC
  Filled 2022-02-11: qty 100

## 2022-02-11 MED ORDER — VISTASEAL 10 ML SINGLE DOSE KIT
PACK | CUTANEOUS | Status: DC | PRN
  Administered 2022-02-11: 10 mL via TOPICAL

## 2022-02-11 MED ORDER — CELECOXIB 200 MG PO CAPS
ORAL_CAPSULE | ORAL | Status: AC
  Administered 2022-02-11: 200 mg via ORAL
  Filled 2022-02-11: qty 1

## 2022-02-11 MED ORDER — DIPHENHYDRAMINE HCL 50 MG/ML IJ SOLN: 12.5000 mg | Freq: Four times a day (QID) | INTRAMUSCULAR | Status: DC | PRN

## 2022-02-11 MED ORDER — OXYCODONE HCL 5 MG PO TABS
5.0000 mg | ORAL_TABLET | ORAL | Status: DC | PRN
  Administered 2022-02-12: 10 mg via ORAL
  Filled 2022-02-11: qty 2

## 2022-02-11 MED ORDER — FAMOTIDINE 20 MG PO TABS
ORAL_TABLET | ORAL | Status: AC
  Administered 2022-02-11: 20 mg via ORAL
  Filled 2022-02-11: qty 1

## 2022-02-11 MED ORDER — CHLORHEXIDINE GLUCONATE 0.12 % MT SOLN
OROMUCOSAL | Status: AC
  Administered 2022-02-11: 15 mL via OROMUCOSAL
  Filled 2022-02-11: qty 15

## 2022-02-11 MED ORDER — SODIUM CHLORIDE 0.9 % IV SOLN: INTRAVENOUS | Status: DC

## 2022-02-11 MED ORDER — KETOROLAC TROMETHAMINE 15 MG/ML IJ SOLN
15.0000 mg | Freq: Four times a day (QID) | INTRAMUSCULAR | Status: DC
  Administered 2022-02-11 – 2022-02-12 (×3): 15 mg via INTRAVENOUS
  Filled 2022-02-11 (×2): qty 1

## 2022-02-11 MED ORDER — PHENYLEPHRINE 80 MCG/ML (10ML) SYRINGE FOR IV PUSH (FOR BLOOD PRESSURE SUPPORT)
PREFILLED_SYRINGE | INTRAVENOUS | Status: DC | PRN
  Administered 2022-02-11: 160 ug via INTRAVENOUS

## 2022-02-11 MED ORDER — PROPOFOL 10 MG/ML IV BOLUS: INTRAVENOUS | Status: DC | PRN

## 2022-02-11 MED ORDER — ENOXAPARIN SODIUM 40 MG/0.4ML IJ SOSY
40.0000 mg | PREFILLED_SYRINGE | INTRAMUSCULAR | Status: DC
  Administered 2022-02-12: 40 mg via SUBCUTANEOUS
  Filled 2022-02-11: qty 0.4

## 2022-02-11 MED ORDER — LIDOCAINE HCL (PF) 2 % IJ SOLN
INTRAMUSCULAR | Status: AC
  Filled 2022-02-11: qty 5

## 2022-02-11 MED ORDER — ACETAMINOPHEN 500 MG PO TABS
1000.0000 mg | ORAL_TABLET | Freq: Four times a day (QID) | ORAL | Status: DC
  Administered 2022-02-11 – 2022-02-12 (×3): 1000 mg via ORAL
  Filled 2022-02-11 (×2): qty 2

## 2022-02-11 MED ORDER — METHOCARBAMOL 1000 MG/10ML IJ SOLN: 500.0000 mg | Freq: Three times a day (TID) | INTRAVENOUS | Status: DC | PRN

## 2022-02-11 MED ORDER — BUPIVACAINE-EPINEPHRINE 0.25% -1:200000 IJ SOLN
INTRAMUSCULAR | Status: DC | PRN
  Administered 2022-02-11: 30 mL

## 2022-02-11 MED ORDER — METHOCARBAMOL 500 MG PO TABS
500.0000 mg | ORAL_TABLET | Freq: Three times a day (TID) | ORAL | Status: DC | PRN
  Administered 2022-02-12: 500 mg via ORAL
  Filled 2022-02-11: qty 1

## 2022-02-11 MED ORDER — HYDROMORPHONE HCL 1 MG/ML IJ SOLN
INTRAMUSCULAR | Status: AC
  Administered 2022-02-11: 0.25 mg via INTRAVENOUS
  Filled 2022-02-11: qty 1

## 2022-02-11 MED ORDER — DIPHENHYDRAMINE HCL 12.5 MG/5ML PO ELIX: 12.5000 mg | ORAL_SOLUTION | Freq: Four times a day (QID) | ORAL | Status: DC | PRN

## 2022-02-11 MED ORDER — DEXMEDETOMIDINE HCL IN NACL 80 MCG/20ML IV SOLN
INTRAVENOUS | Status: DC | PRN
  Administered 2022-02-11: 8 ug via BUCCAL

## 2022-02-11 MED ORDER — PROCHLORPERAZINE EDISYLATE 10 MG/2ML IJ SOLN: 5.0000 mg | Freq: Four times a day (QID) | INTRAMUSCULAR | Status: DC | PRN

## 2022-02-11 MED ORDER — HYDROMORPHONE HCL 1 MG/ML IJ SOLN
0.2500 mg | INTRAMUSCULAR | Status: DC | PRN
  Administered 2022-02-11: 0.25 mg via INTRAVENOUS
  Administered 2022-02-11: 0.5 mg via INTRAVENOUS

## 2022-02-11 MED ORDER — ACETAMINOPHEN 500 MG PO TABS
ORAL_TABLET | ORAL | Status: AC
  Filled 2022-02-11: qty 2

## 2022-02-11 MED ORDER — LIDOCAINE HCL (CARDIAC) PF 100 MG/5ML IV SOSY
PREFILLED_SYRINGE | INTRAVENOUS | Status: DC | PRN
  Administered 2022-02-11: 100 mg via INTRAVENOUS

## 2022-02-11 MED ORDER — ACETAMINOPHEN 10 MG/ML IV SOLN: 1000.0000 mg | Freq: Once | INTRAVENOUS | Status: DC | PRN

## 2022-02-11 MED ORDER — PROPOFOL 1000 MG/100ML IV EMUL
INTRAVENOUS | Status: AC
  Filled 2022-02-11: qty 100

## 2022-02-11 MED ORDER — ROCURONIUM BROMIDE 100 MG/10ML IV SOLN
INTRAVENOUS | Status: DC | PRN
  Administered 2022-02-11: 30 mg via INTRAVENOUS
  Administered 2022-02-11: 50 mg via INTRAVENOUS

## 2022-02-11 SURGICAL SUPPLY — 61 items
APPLICATOR VISTASEAL 35 (MISCELLANEOUS) IMPLANT
BLADE SURG 15 STRL LF DISP TIS (BLADE) ×2 IMPLANT
BLADE SURG 15 STRL SS (BLADE) ×2
CANNULA REDUC XI 12-8 STAPL (CANNULA) ×2
CANNULA REDUCER 12-8 DVNC XI (CANNULA) ×2 IMPLANT
DERMABOND ADVANCED .7 DNX12 (GAUZE/BANDAGES/DRESSINGS) ×2 IMPLANT
DRAPE 3/4 80X56 (DRAPES) ×2 IMPLANT
DRAPE ARM DVNC X/XI (DISPOSABLE) ×8 IMPLANT
DRAPE COLUMN DVNC XI (DISPOSABLE) ×2 IMPLANT
DRAPE DA VINCI XI ARM (DISPOSABLE) ×8
DRAPE DA VINCI XI COLUMN (DISPOSABLE) ×2
ELECT CAUTERY BLADE 6.4 (BLADE) ×2 IMPLANT
ELECT REM PT RETURN 9FT ADLT (ELECTROSURGICAL) ×2
ELECTRODE REM PT RTRN 9FT ADLT (ELECTROSURGICAL) ×2 IMPLANT
GLOVE BIO SURGEON STRL SZ7 (GLOVE) ×6 IMPLANT
GOWN STRL REUS W/ TWL LRG LVL3 (GOWN DISPOSABLE) ×8 IMPLANT
GOWN STRL REUS W/TWL LRG LVL3 (GOWN DISPOSABLE) ×8
GRASPER LAPSCPC 5X45 DSP (INSTRUMENTS) ×2 IMPLANT
IRRIGATION STRYKERFLOW (MISCELLANEOUS) IMPLANT
IRRIGATOR STRYKERFLOW (MISCELLANEOUS)
IV NS 1000ML (IV SOLUTION)
IV NS 1000ML BAXH (IV SOLUTION) IMPLANT
KIT PINK PAD W/HEAD ARE REST (MISCELLANEOUS) ×2
KIT PINK PAD W/HEAD ARM REST (MISCELLANEOUS) ×2 IMPLANT
KIT TURNOVER CYSTO (KITS) ×2 IMPLANT
LABEL OR SOLS (LABEL) ×2 IMPLANT
MANIFOLD NEPTUNE II (INSTRUMENTS) ×2 IMPLANT
MESH BIO-A 7X10 SYN MAT (Mesh General) IMPLANT
NEEDLE HYPO 22GX1.5 SAFETY (NEEDLE) ×2 IMPLANT
OBTURATOR OPTICAL STANDARD 8MM (TROCAR) ×2
OBTURATOR OPTICAL STND 8 DVNC (TROCAR) ×2
OBTURATOR OPTICALSTD 8 DVNC (TROCAR) ×2 IMPLANT
PACK LAP CHOLECYSTECTOMY (MISCELLANEOUS) ×2 IMPLANT
PENCIL SMOKE EVACUATOR (MISCELLANEOUS) ×2 IMPLANT
SEAL CANN UNIV 5-8 DVNC XI (MISCELLANEOUS) ×6 IMPLANT
SEAL XI 5MM-8MM UNIVERSAL (MISCELLANEOUS) ×6
SEALER VESSEL DA VINCI XI (MISCELLANEOUS) ×2
SEALER VESSEL EXT DVNC XI (MISCELLANEOUS) ×2 IMPLANT
SOLUTION ELECTROLUBE (MISCELLANEOUS) ×2 IMPLANT
SPIKE FLUID TRANSFER (MISCELLANEOUS) ×2 IMPLANT
SPONGE T-LAP 18X18 ~~LOC~~+RFID (SPONGE) ×2 IMPLANT
STAPLER CANNULA SEAL DVNC XI (STAPLE) ×2 IMPLANT
STAPLER CANNULA SEAL XI (STAPLE) ×2
SUT MNCRL 4-0 (SUTURE) ×2
SUT MNCRL 4-0 27XMFL (SUTURE) ×2
SUT SILK 2 0 SH (SUTURE) ×6 IMPLANT
SUT VIC AB 3-0 SH 27 (SUTURE)
SUT VIC AB 3-0 SH 27X BRD (SUTURE) IMPLANT
SUT VICRYL 0 AB UR-6 (SUTURE) IMPLANT
SUT VICRYL 0 UR6 27IN ABS (SUTURE) ×4 IMPLANT
SUT VLOC 90 S/L VL9 GS22 (SUTURE) ×2 IMPLANT
SUTURE MNCRL 4-0 27XMF (SUTURE) ×2 IMPLANT
SYR 20ML LL LF (SYRINGE) ×2 IMPLANT
SYR 30ML LL (SYRINGE) ×2 IMPLANT
SYS BAG RETRIEVAL 10MM (BASKET)
SYSTEM BAG RETRIEVAL 10MM (BASKET) IMPLANT
TRAP FLUID SMOKE EVACUATOR (MISCELLANEOUS) ×2 IMPLANT
TRAY FOLEY SLVR 16FR LF STAT (SET/KITS/TRAYS/PACK) ×2 IMPLANT
TROCAR XCEL NON-BLD 5MMX100MML (ENDOMECHANICALS) ×2 IMPLANT
TUBING EVAC SMOKE HEATED PNEUM (TUBING) ×2 IMPLANT
WATER STERILE IRR 500ML POUR (IV SOLUTION) ×2 IMPLANT

## 2022-02-11 NOTE — Anesthesia Procedure Notes (Signed)
Procedure Name: Intubation Date/Time: 02/11/2022 7:55 AM  Performed by: Johnna Acosta, CRNAPre-anesthesia Checklist: Patient identified, Emergency Drugs available, Suction available, Patient being monitored and Timeout performed Patient Re-evaluated:Patient Re-evaluated prior to induction Oxygen Delivery Method: Circle system utilized Preoxygenation: Pre-oxygenation with 100% oxygen Induction Type: IV induction Ventilation: Mask ventilation with difficulty Laryngoscope Size: McGraph and 3 Grade View: Grade III Tube type: Oral Tube size: 6.5 mm Number of attempts: 1 Airway Equipment and Method: Stylet and Video-laryngoscopy Placement Confirmation: ETT inserted through vocal cords under direct vision, positive ETCO2 and breath sounds checked- equal and bilateral Secured at: 21 cm Tube secured with: Tape Dental Injury: Teeth and Oropharynx as per pre-operative assessment  Difficulty Due To: Difficulty was anticipated, Difficult Airway- due to limited oral opening and Difficult Airway- due to dentition

## 2022-02-11 NOTE — Anesthesia Postprocedure Evaluation (Signed)
Anesthesia Post Note  Patient: Kristen Macdonald  Procedure(s) Performed: XI ROBOTIC ASSISTED PARAESOPHAGEAL HERNIA REPAIR, RNFA to assist INSERTION OF MESH  Patient location during evaluation: PACU Anesthesia Type: General Level of consciousness: awake and alert Pain management: pain level controlled Vital Signs Assessment: post-procedure vital signs reviewed and stable Respiratory status: spontaneous breathing, nonlabored ventilation and respiratory function stable Cardiovascular status: blood pressure returned to baseline and stable Postop Assessment: no apparent nausea or vomiting Anesthetic complications: yes   Encounter Notable Events  Notable Event Outcome Phase Comment  Difficult to intubate - expected  Intraprocedure Filed from anesthesia note documentation.     Last Vitals:  Vitals:   02/11/22 1345 02/11/22 1513  BP: 95/69 136/81  Pulse: 89 99  Resp: 19 16  Temp:  36.6 C  SpO2: 99% 98%    Last Pain:  Vitals:   02/11/22 1513  TempSrc:   PainSc: [place]

## 2022-02-11 NOTE — Anesthesia Preprocedure Evaluation (Addendum)
Anesthesia Evaluation  Patient identified by MRN, date of birth, ID band Patient awake    Reviewed: Allergy & Precautions, NPO status , Patient's Chart, lab work & pertinent test results  Airway Mallampati: II  TM Distance: >3 FB Neck ROM: full    Dental no notable dental hx.    Pulmonary neg pulmonary ROS   Pulmonary exam normal        Cardiovascular hypertension, Normal cardiovascular exam     Neuro/Psych  PSYCHIATRIC DISORDERS Anxiety     negative neurological ROS     GI/Hepatic Neg liver ROS,GERD  ,,  Endo/Other  negative endocrine ROS    Renal/GU      Musculoskeletal   Abdominal Normal abdominal exam  (+)   Peds  Hematology  (+) Blood dyscrasia, anemia   Anesthesia Other Findings Past Medical History: No date: GERD (gastroesophageal reflux disease) No date: Hyperlipidemia No date: Hypertension  Past Surgical History: No date: TONSILLECTOMY No date: WISDOM TOOTH EXTRACTION  BMI    Body Mass Index: 28.34 kg/m      Reproductive/Obstetrics negative OB ROS                             Anesthesia Physical Anesthesia Plan  ASA: 2  Anesthesia Plan: General ETT   Post-op Pain Management: Tylenol PO (pre-op)*, Celebrex PO (pre-op)* and Gabapentin PO (pre-op)*   Induction: Intravenous  PONV Risk Score and Plan: 4 or greater and Ondansetron, Dexamethasone, Midazolam and Droperidol  Airway Management Planned: Oral ETT  Additional Equipment:   Intra-op Plan:   Post-operative Plan: Extubation in OR  Informed Consent: I have reviewed the patients History and Physical, chart, labs and discussed the procedure including the risks, benefits and alternatives for the proposed anesthesia with the patient or authorized representative who has indicated his/her understanding and acceptance.     Dental Advisory Given  Plan Discussed with: Anesthesiologist, CRNA and  Surgeon  Anesthesia Plan Comments:         Anesthesia Quick Evaluation

## 2022-02-11 NOTE — Progress Notes (Signed)
Patient up and walking with nurse tech.  Went to bathroom x1.

## 2022-02-11 NOTE — Op Note (Signed)
Robotic assisted laparoscopic repair of  paraesophageal  hernia with Bio-A Mesh and Nissen fundoplication  Pre-operative Diagnosis: GERD, hiatal hernia  Post-operative Diagnosis: same  Procedure:  Robotic assisted laparoscopic repair of  paraesophageal  hernia with Bio-A Mesh and Nissen fundoplication  Surgeon: Caroleen Hamman, MD FACS  Assistant: [facility] RNFA. Required due to the complexity of the case the need for exposure and lack of first assist.  Anesthesia: Gen. with endotracheal tube  Findings: Type III paraesophageal hernia w 1/3 of the stomach within mediastinum Loose wrap 360 degree over 50 FR Bougie   Estimated Blood Loss: 10cc       Specimens: sac           Complications: none   Procedure Details  The patient was seen again in the Holding Room. The benefits, complications, treatment options, and expected outcomes were discussed with the patient. The risks of bleeding, infection, recurrence of symptoms, failure to resolve symptoms,  esophageal damage, Dysphagia, bowel injury, any of which could require further surgery were reviewed with the patient. The likelihood of improving the patient's symptoms with return to their baseline status is good.  The patient and/or family concurred with the proposed plan, giving informed consent.  The patient was taken to Operating Room, identified  and the procedure verified.  A Time Out was held and the above information confirmed.  Prior to the induction of general anesthesia, antibiotic prophylaxis was administered. VTE prophylaxis was in place. General endotracheal anesthesia was then administered and tolerated well. After the induction, the abdomen was prepped with Chloraprep and draped in the sterile fashion. The patient was positioned in the supine position.  Cut down technique was used to enter the abdominal cavity and a Hasson trochar was placed after two vicryl stitches were anchored to the fascia. Pneumoperitoneum was then  created with CO2 and tolerated well without any adverse changes in the patient's vital signs.  Three 8-mm ports were placed under direct vision. All skin incisions  were infiltrated with a local anesthetic agent before making the incision and placing the trocars. An additional 5 mm regular laparoscopic port was placed to assist with retraction and exposure.   The patient was positioned  in reverse Trendelenburg, robot was brought to the surgical field and docked in the standard fashion.  We made sure all the instrumentation was kept indirect view at all times and that there were no collision between the arms. I scrubbed out and went to the console.  I used a robotic arm to retract the liver, the vessel sealer on my right hand and a forced bipolar grasper on my left hand.  There is along the extra 5 mm port allow me ample exposure and the ability to perform meticulous dissection  We Started dividing the lesser omentum via the pars flaccida.  We Were able to dissect the lesser curvature of the stomach and  dissected the fundus free from the right and left crus.  We circumferentially dissected the GE junction.  The hernia sac was also completely reduced and we were able to bring the stomach into the intra-abdominal position.  Attention then was turned to the greater curvature where the short gastrics were divided with sealer device.  We were able to identify the left crus and again were able to make sure there was a good circumferential dissection and that the hernia sac was completely excised.  We did perform a good dissection within the mediastinum to allow a complete reduction of the sac,  gain esophageal length and a to completely allow an intra-abdominal fundoplication. Using two strips of Bio-A as pledgets we approximated the crus with a 2-0V lock suture. A bio-A 10x7 cm mesh was inserted and secured using Vistaseal.   We Asked anesthesia to place a 50 French bougie and this went easily.  We also observe  trajectory of the bougie. 360 degree Nissen fundoplication was created with multiple 2-0 silk sutures and we placed 3 stitches taking some of the esophagus within that bite.  The fundoplication measured approximately 3-1/2 cm and he was floppy. I was very happy with the way the fundoplication laid and the repair of the hernia.  Inspection of the  upper quadrant was performed. No bleeding, bile  Or esophageal injuries leaks, or bowel injuries were noted. Robotic instruments and robotic arms were undocked in the standard fashion. All the needles were removed under direct visualization.   I scrubbed back in.  Pneumoperitoneum was released.  The periumbilical port site was closed with interrumpted 0 Vicryl sutures. 4-0 subcuticular Monocryl was used to close the skin. Liposomal marcaine was injected to all the incisions sites.  Dermabond was  applied.  The patient was then extubated and brought to the recovery room in stable condition. Sponge, lap, and needle counts were correct at closure and at the conclusion of the case.               Caroleen Hamman, MD, FACS

## 2022-02-11 NOTE — Interval H&P Note (Signed)
History and Physical Interval Note:  02/11/2022 7:28 AM  Kristen Macdonald  has presented today for surgery, with the diagnosis of Paraesophageal hernia.  The various methods of treatment have been discussed with the patient and family. After consideration of risks, benefits and other options for treatment, the patient has consented to  Procedure(s): XI ROBOTIC ASSISTED PARAESOPHAGEAL HERNIA REPAIR, RNFA to assist (N/A) as a surgical intervention.  The patient's history has been reviewed, patient examined, no change in status, stable for surgery.  I have reviewed the patient's chart and labs.  Questions were answered to the patient's satisfaction.   I have specifically discussed with her the level of hb. 9.6. she has had low level a couple of months ago when she saw Dr. Ouida Sills but apparently she was not aware. As precaution we have type and screen her. She denies any obvious GI  blood looses, last colonoscopy was 2 years ago and no evidence of masses or concerning lesions, she did have CT scan w/o obvious colon masses. D/W the pt in detail about options. We can certainly stop and refer her to hematologist for further w/u. We can also proceed and then refer to hematologist. She also is confessing that she takes BC/goodies daily . Certainly that can cause ulcerations throughout the GI tract and explain anemia. After extensive counseling the pt and husband wish to proceed.   [place]

## 2022-02-11 NOTE — Transfer of Care (Signed)
Immediate Anesthesia Transfer of Care Note  Patient: Kristen Macdonald  Procedure(s) Performed: XI ROBOTIC ASSISTED PARAESOPHAGEAL HERNIA REPAIR, RNFA to assist INSERTION OF MESH  Patient Location: PACU  Anesthesia Type:General  Level of Consciousness: drowsy  Airway & Oxygen Therapy: Patient Spontanous Breathing and Patient connected to face mask oxygen  Post-op Assessment: Report given to RN and Post -op Vital signs reviewed and stable  Post vital signs: Reviewed and stable  Last Vitals:  Vitals Value Taken Time  BP 106/68 02/11/22 1001  Temp    Pulse 81 02/11/22 1007  Resp 12 02/11/22 1007  SpO2 100 % 02/11/22 1007  Vitals shown include unvalidated device data.  Last Pain:  Vitals:   02/11/22 0614  TempSrc: Oral  PainSc: 0-No pain         Complications:  Encounter Notable Events  Notable Event Outcome Phase Comment  Difficult to intubate - expected  Intraprocedure Filed from anesthesia note documentation.

## 2022-02-12 DIAGNOSIS — I10 Essential (primary) hypertension: Secondary | ICD-10-CM | POA: Diagnosis not present

## 2022-02-12 DIAGNOSIS — K219 Gastro-esophageal reflux disease without esophagitis: Secondary | ICD-10-CM | POA: Diagnosis not present

## 2022-02-12 DIAGNOSIS — K440 Diaphragmatic hernia with obstruction, without gangrene: Secondary | ICD-10-CM | POA: Diagnosis not present

## 2022-02-12 LAB — HIV ANTIBODY (ROUTINE TESTING W REFLEX): HIV Screen 4th Generation wRfx: NONREACTIVE

## 2022-02-12 LAB — SURGICAL PATHOLOGY

## 2022-02-12 MED ORDER — IBUPROFEN 600 MG PO TABS: 600.0000 mg | ORAL_TABLET | Freq: Four times a day (QID) | ORAL | 0 refills | Status: DC | PRN

## 2022-02-12 MED ORDER — OXYCODONE HCL 5 MG PO TABS: 5.0000 mg | ORAL_TABLET | Freq: Four times a day (QID) | ORAL | 0 refills | Status: DC | PRN

## 2022-02-12 MED ORDER — FERROUS SULFATE 325 (65 FE) MG PO TABS: 325.0000 mg | ORAL_TABLET | Freq: Every day | ORAL | 0 refills | Status: AC

## 2022-02-12 MED ORDER — METHOCARBAMOL 500 MG PO TABS: 500.0000 mg | ORAL_TABLET | Freq: Three times a day (TID) | ORAL | 0 refills | Status: DC | PRN

## 2022-02-12 NOTE — TOC CM/SW Note (Signed)
Patient has orders to discharge home today. Chart reviewed. No TOC needs identified. CSW signing off.  Marquest Gunkel, CSW [phone]  

## 2022-02-12 NOTE — Discharge Instructions (Signed)
In addition to included general post-operative instructions,  Diet: Recommend following Nissen diet recommendations x4 weeks. Hand out given for this.  Activity: No heavy lifting >20 pounds (children, pets, laundry, garbage) or strenuous activity for 4 weeks, but light activity and walking are encouraged. Do not drive or drink alcohol if taking narcotic pain medications or having pain that might distract from driving.  Wound care: 2 days after surgery (11/11), you may shower/get incision wet with soapy water and pat dry (do not rub incisions), but no baths or submerging incision underwater until follow-up.   Medications: Resume all home medications. For mild to moderate pain: acetaminophen (Tylenol) or ibuprofen/naproxen (if no kidney disease). Combining Tylenol with alcohol can substantially increase your risk of causing liver disease. Narcotic pain medications, if prescribed, can be used for severe pain, though may cause nausea, constipation, and drowsiness. Do not combine Tylenol and Percocet (or similar) within a 6 hour period as Percocet (and similar) contain(s) Tylenol. If you do not need the narcotic pain medication, you do not need to fill the prescription.  Call office [phone]) at any time if any questions, worsening pain, fevers/chills, bleeding, drainage from incision site, or other concerns.

## 2022-02-12 NOTE — Discharge Summary (Addendum)
[facility] SURGICAL DISCHARGE SUMMARY  Patient ID: Kristen Macdonald MRN: [id number] DOB/AGE: 53/19/1972 53 y.o.  Admit date: 02/11/2022 Discharge date: 02/12/2022  Discharge Diagnoses Patient Active Problem List   Diagnosis Date Noted   Hiatal hernia 02/11/2022   S/P repair of paraesophageal hernia 02/11/2022   Gastroesophageal reflux disease without esophagitis 01/19/2018   Psychophysiological insomnia 01/19/2018    Consultants None  Procedures 02/21/2022:  Robotic assisted laparoscopic paraesophageal hernia repair and Nissen fundoplication   HPI: Kristen Macdonald is a 51 y.o. female with history of hiatal hernia who presents to [facility] on 11/09 for scheduled repair with Dr Dahlia Byes.  Hospital Course: Informed consent was obtained and documented, and patient underwent uneventful Robotic assisted laparoscopic paraesophageal hernia repair and Nissen fundoplication (Dr Dahlia Byes, [id number]).  Post-operatively, patient did well.  Advancement of patient's diet and ambulation were well-tolerated. The remainder of patient's hospital course was essentially unremarkable, and discharge planning was initiated accordingly with patient safely able to be discharged home with appropriate discharge instructions, pain control, and outpatient follow-up after all of her questions were answered to her expressed satisfaction.   Discharge Condition: Good   Physical Examination:  Constitutional: Well appearing female, NAD Pulmonary: Normal effort, no respiratory distress  Gastrointestinal: Soft, non-tender, non-distended, no rebound/guarding  Skin: Laparoscopic incisions are CDI with dermabond, no erythema or drainage    Allergies as of 02/12/2022       Reactions   Codeine Nausea Only        Medication List     TAKE these medications    bisoprolol-hydrochlorothiazide 5-6.25 MG tablet Commonly known as: ZIAC Take 1 tablet by mouth daily.   ferrous sulfate 325 (65 FE) MG  tablet Commonly known as: FerrouSul Take 1 tablet (325 mg total) by mouth daily with breakfast.   ibuprofen 600 MG tablet Commonly known as: ADVIL Take 1 tablet (600 mg total) by mouth every 6 (six) hours as needed.   methocarbamol 500 MG tablet Commonly known as: ROBAXIN Take 1 tablet (500 mg total) by mouth every 8 (eight) hours as needed for muscle spasms.   naproxen sodium 220 MG tablet Commonly known as: ALEVE Take 220 mg by mouth daily as needed.   oxyCODONE 5 MG immediate release tablet Commonly known as: Oxy IR/ROXICODONE Take 1 tablet (5 mg total) by mouth every 6 (six) hours as needed for severe pain or breakthrough pain.          Follow-up Information     Pabon, [place] F, MD. Schedule an appointment as soon as possible for a visit on 02/24/2022.   Specialty: General Surgery Why: s/p paraesophageal hernia repair Appointment Wednesday 02/24/22 at 11:00 Contact information: [address] [place] [address] [phone]         [facility]. Schedule an appointment as soon as possible for a visit.   Why: Hematology; Follow up for iron deficiency, started on iron supplementation                 Time spent on discharge management including discussion of hospital course, clinical condition, outpatient instructions, prescriptions, and follow up with the patient and members of the medical team: >30 minutes  -- Edison Simon , PA-C [facility]  02/12/2022, 9:56 AM [phone] M-F: 7am - 4pm

## 2022-02-16 ENCOUNTER — Telehealth: Payer: Self-pay

## 2022-02-16 NOTE — Telephone Encounter (Signed)
error 

## 2022-02-24 ENCOUNTER — Ambulatory Visit (INDEPENDENT_AMBULATORY_CARE_PROVIDER_SITE_OTHER): Payer: BC Managed Care – PPO | Admitting: Surgery

## 2022-02-24 ENCOUNTER — Other Ambulatory Visit: Payer: Self-pay

## 2022-02-24 ENCOUNTER — Encounter: Payer: Self-pay | Admitting: Surgery

## 2022-02-24 VITALS — BP 119/85 | HR 69 | Temp 98.4°F | Ht 63.0 in | Wt 155.0 lb

## 2022-02-24 DIAGNOSIS — K449 Diaphragmatic hernia without obstruction or gangrene: Secondary | ICD-10-CM

## 2022-02-24 DIAGNOSIS — Z09 Encounter for follow-up examination after completed treatment for conditions other than malignant neoplasm: Secondary | ICD-10-CM

## 2022-02-24 DIAGNOSIS — D509 Iron deficiency anemia, unspecified: Secondary | ICD-10-CM

## 2022-02-24 NOTE — Progress Notes (Signed)
Simaya is 2 weeks out from robotic paraesophageal hernia repair.  She has done extremely well.  No fevers or chills she is tolerating Nissen diet without nausea or vomiting. Denies melena or hematochezia  PE NAD Chest: CTA, s1,s2 Abd: Soft nontender incisions healing well without infection.  A/P doing well after paraesophageal hernia.  I suspect that the etiology of her chronic anemia might be related to NSAIDs abuse.  I will place consult for hematology oncology to further workup her anemia.  I will see her back in a few months

## 2022-02-24 NOTE — Patient Instructions (Addendum)
No bread. No carbonated beverages. You may try ground beef and shredded meat. Stay away from gassy foods. We will contact you January 2024 to schedule a follow up appointment for February 2024.

## 2022-03-02 ENCOUNTER — Ambulatory Visit (INDEPENDENT_AMBULATORY_CARE_PROVIDER_SITE_OTHER): Payer: BC Managed Care – PPO | Admitting: Internal Medicine

## 2022-03-02 ENCOUNTER — Encounter: Payer: Self-pay | Admitting: Internal Medicine

## 2022-03-02 VITALS — BP 126/66 | HR 73 | Temp 97.7°F | Wt 155.0 lb

## 2022-03-02 DIAGNOSIS — D509 Iron deficiency anemia, unspecified: Secondary | ICD-10-CM

## 2022-03-02 DIAGNOSIS — F419 Anxiety disorder, unspecified: Secondary | ICD-10-CM | POA: Diagnosis not present

## 2022-03-02 DIAGNOSIS — K219 Gastro-esophageal reflux disease without esophagitis: Secondary | ICD-10-CM | POA: Diagnosis not present

## 2022-03-02 DIAGNOSIS — I10 Essential (primary) hypertension: Secondary | ICD-10-CM

## 2022-03-02 DIAGNOSIS — Z79899 Other long term (current) drug therapy: Secondary | ICD-10-CM | POA: Diagnosis not present

## 2022-03-02 DIAGNOSIS — E7800 Pure hypercholesterolemia, unspecified: Secondary | ICD-10-CM

## 2022-03-02 DIAGNOSIS — F5104 Psychophysiologic insomnia: Secondary | ICD-10-CM

## 2022-03-02 NOTE — Assessment & Plan Note (Signed)
Not an issue after paraesophageal hernia repair

## 2022-03-02 NOTE — Progress Notes (Signed)
Subjective:    Patient ID: Kristen Macdonald, female    DOB: Dec 21, 1970, 51 y.o.   MRN: [id number]  HPI  Patient presents to clinic today to reestablish care and for follow-up of chronic conditions.  HTN: Her BP today is 126/66.  She is taking Bisoprolol-HCTZ as prescribed.  ECG from 02/2022 reviewed.  Anxiety: Situational.  She is not currently taking any medications for this.  She is not currently seeing a therapist.  She denies depression, SI/HI.  GERD: Triggered by her hiatal hernia which was surgically repaired.  She is not currently taking any medications for this.  There is no upper GI on file.  Insomnia: Not currently an issue. She is not taking any medications for this.  There is no sleep study on file.  HLD: Her last LDL was 110, triglycerides 66, 07/2019.  She is not taking any cholesterol-lowering medication at this time.  She tries to consume low-fat diet.  Anemia: Her last H/H was 9.5/31.5, 02/2022. She has not had menses in 2 years. She does eat red meat. She has not had blood in her stool but did have dark stool prior to her hernia repair.  She is taking oral Iron as prescribed.  She does not follow with hematology.  Review of Systems     Past Medical History:  Diagnosis Date   GERD (gastroesophageal reflux disease)    Hyperlipidemia    Hypertension     Current Outpatient Medications  Medication Sig Dispense Refill   bisoprolol-hydrochlorothiazide (ZIAC) 5-6.25 MG tablet Take 1 tablet by mouth daily.  2   ferrous sulfate (FERROUSUL) 325 (65 FE) MG tablet Take 1 tablet (325 mg total) by mouth daily with breakfast. 30 tablet 0   ibuprofen (ADVIL) 600 MG tablet Take 1 tablet (600 mg total) by mouth every 6 (six) hours as needed. 30 tablet 0   methocarbamol (ROBAXIN) 500 MG tablet Take 1 tablet (500 mg total) by mouth every 8 (eight) hours as needed for muscle spasms. 15 tablet 0   naproxen sodium (ALEVE) 220 MG tablet Take 220 mg by mouth daily as needed.     No  current facility-administered medications for this visit.    Allergies  Allergen Reactions   Codeine Nausea Only    Family History  Problem Relation Age of Onset   Breast cancer Mother    Hypertension Mother    Anxiety disorder Mother    Heart disease Father     Social History   Socioeconomic History   Marital status: Married    Spouse name: Christia Reading   Number of children: 1   Years of education: Not on file   Highest education level: Not on file  Occupational History   Not on file  Tobacco Use   Smoking status: Never   Smokeless tobacco: Never  Substance and Sexual Activity   Alcohol use: Yes    Comment: occasional   Drug use: Never   Sexual activity: Not on file  Other Topics Concern   Not on file  Social History Narrative   Not on file   Social Determinants of Health   Financial Resource Strain: Not on file  Food Insecurity: No Food Insecurity (02/11/2022)   Hunger Vital Sign    Worried About Running Out of Food in the Last Year: Never true    Ran Out of Food in the Last Year: Never true  Transportation Needs: No Transportation Needs (02/11/2022)   PRAPARE - Transportation    Lack of  Transportation (Medical): No    Lack of Transportation (Non-Medical): No  Physical Activity: Not on file  Stress: Not on file  Social Connections: Not on file  Intimate Partner Violence: Not At Risk (02/11/2022)   Humiliation, Afraid, Rape, and Kick questionnaire    Fear of Current or Ex-Partner: No    Emotionally Abused: No    Physically Abused: No    Sexually Abused: No     Constitutional: Denies fever, malaise, fatigue, headache or abrupt weight changes.  HEENT: Denies eye pain, eye redness, ear pain, ringing in the ears, wax buildup, runny nose, nasal congestion, bloody nose, or sore throat. Respiratory: Patient reports intermittent shortness of breath.  Denies difficulty breathing, shortness of breath, cough or sputum production.   Cardiovascular: Denies chest pain,  chest tightness, palpitations or swelling in the hands or feet.  Gastrointestinal: Denies abdominal pain, bloating, constipation, diarrhea or blood in the stool.  GU: Denies urgency, frequency, pain with urination, burning sensation, blood in urine, odor or discharge. Musculoskeletal: Denies decrease in range of motion, difficulty with gait, muscle pain or joint pain and swelling.  Skin: Denies redness, rashes, lesions or ulcercations.  Neurological: Patient has a history of insomnia.  Denies dizziness, difficulty with memory, difficulty with speech or problems with balance and coordination.  Psych: Patient has a history of anxiety.  Denies depression, SI/HI.  No other specific complaints in a complete review of systems (except as listed in HPI above).  Objective:   Physical Exam   BP 126/66 (BP Location: Left Arm, Patient Position: Sitting, Cuff Size: Normal)   Pulse 73   Temp 97.7 F (36.5 C) (Temporal)   Wt 155 lb (70.3 kg)   SpO2 100%   BMI 27.46 kg/m   Wt Readings from Last 3 Encounters:  02/24/22 155 lb (70.3 kg)  02/11/22 160 lb (72.6 kg)  01/18/22 161 lb (73 kg)    General: Appears her stated age, overweight, in NAD. Skin: Warm, dry and intact.  She has 5 lap sites across the lower abdomen which are intact with surgical glue. HEENT: Head: normal shape and size; Eyes: sclera white, no icterus, conjunctiva pink, PERRLA and EOMs intact;  Cardiovascular: Normal rate and rhythm. S1,S2 noted.  No murmur, rubs or gallops noted. No JVD or BLE edema. No carotid bruits noted. Pulmonary/Chest: Normal effort and positive vesicular breath sounds. No respiratory distress. No wheezes, rales or ronchi noted.  Abdomen: Normal bowel sounds.  Musculoskeletal:  No difficulty with gait.  Neurological: Alert and oriented.  Psychiatric: Mood and affect normal. Behavior is normal. Judgment and thought content normal.    BMET    Component Value Date/Time   NA 143 02/08/2022 0825   NA 137  12/25/2011 2114   K 4.0 02/08/2022 0825   K 3.5 12/25/2011 2114   CL 112 (H) 02/08/2022 0825   CL 102 12/25/2011 2114   CO2 24 02/08/2022 0825   CO2 24 12/25/2011 2114   GLUCOSE 99 02/08/2022 0825   GLUCOSE 102 (H) 12/25/2011 2114   BUN 12 02/08/2022 0825   BUN 7 12/25/2011 2114   CREATININE 0.87 02/11/2022 2025   CREATININE 0.78 12/25/2011 2114   CALCIUM 9.4 02/08/2022 0825   CALCIUM 9.0 12/25/2011 2114   GFRNONAA >60 02/11/2022 2025   GFRNONAA >60 12/25/2011 2114   GFRAA >60 12/25/2011 2114    Lipid Panel     Component Value Date/Time   CHOL 179 07/31/2019 1553   TRIG 66.0 07/31/2019 1553   HDL 55.40  07/31/2019 1553   CHOLHDL 3 07/31/2019 1553   VLDL 13.2 07/31/2019 1553   LDLCALC 110 (H) 07/31/2019 1553    CBC    Component Value Date/Time   WBC 9.5 02/11/2022 2025   RBC 4.21 02/11/2022 2025   HGB 9.5 (L) 02/11/2022 2025   HGB 15.1 12/25/2011 2114   HCT 31.5 (L) 02/11/2022 2025   HCT 43.0 12/25/2011 2114   PLT 260 02/11/2022 2025   PLT 207 12/25/2011 2114   MCV 74.8 (L) 02/11/2022 2025   MCV 88 12/25/2011 2114   MCH 22.6 (L) 02/11/2022 2025   MCHC 30.2 02/11/2022 2025   RDW 15.9 (H) 02/11/2022 2025   RDW 12.9 12/25/2011 2114   LYMPHSABS 1.6 06/17/2020 1153   MONOABS 0.5 06/17/2020 1153   EOSABS 0.1 06/17/2020 1153   BASOSABS 0.0 06/17/2020 1153    Hgb A1C No results found for: "HGBA1C"         Assessment & Plan:      RTC in 6 months for your annual exam Webb Silversmith, NP

## 2022-03-02 NOTE — Assessment & Plan Note (Addendum)
We will check CBC with differential, iron panel, B12 and folate Will refer to hematology if needed based on labs Continue oral iron

## 2022-03-02 NOTE — Assessment & Plan Note (Signed)
Currently not an issue 

## 2022-03-02 NOTE — Patient Instructions (Signed)
Iron Deficiency Anemia, Adult  Iron deficiency anemia is when you do not have enough red blood cells or hemoglobin in your blood. This happens because you have too little iron in your body. Hemoglobin carries oxygen to parts of the body. Anemia can cause your body to not get enough oxygen. What are the causes? Not eating enough foods that have iron in them. The body not being able to take in iron well. Blood loss. What increases the risk? Having menstrual periods. Being pregnant. What are the signs or symptoms? Pale skin, lips, and nails. Weakness, dizziness, and getting tired easily. Feeling like you cannot breathe well when moving (shortness of breath). Cold hands and feet. Mild anemia may not cause any symptoms. How is this treated? This condition is treated by finding out why you do not have enough iron and then getting more iron. It may include: Adding foods to your diet that have a lot of iron. Taking iron pills (supplements). If you are pregnant or breastfeeding, you may need to take extra iron. Your diet often does not provide the amount of iron that you need. Getting more vitamin C in your diet. Vitamin C helps your body take in iron. You may need to take iron pills with a glass of orange juice or vitamin C pills. Medicines to make heavy menstrual periods lighter. Surgery or testing procedures to find what is causing the condition. You may need blood tests to see if treatment is working. If the treatment does not seem to be working, you may need more tests. Follow these instructions at home: Medicines Take over-the-counter and prescription medicines only as told by your doctor. This includes iron pills and vitamins. Taking them as told is important because too much iron can be harmful. Take iron pills when your stomach is empty. If you cannot handle this, take them with food. Do not drink milk or take antacids at the same time as your iron pills. Iron pills may turn your poop  (stool)black. If you cannot handle taking iron pills by mouth, ask your doctor about getting iron through: An IV tube. A shot (injection) into a muscle. Eating and drinking Talk with your doctor before changing the foods you eat. Your doctor may tell you to eat foods that have a lot of iron, such as: Liver. Low-fat (lean) beef. Breads and cereals that have iron added to them. Eggs. Dried fruit. Dark green, leafy vegetables. Eat fresh fruits and vegetables that are high in vitamin C. They help your body use iron. Foods with a lot of vitamin C include: Oranges. Peppers. Tomatoes. Mangoes. Managing constipation If you are taking iron pills, they may cause trouble pooping (constipation). To prevent or treat this, you may need to: Drink enough fluid to keep your pee (urine) pale yellow. Take over-the-counter or prescription medicines. Eat foods that are high in fiber. These include beans, whole grains, and fresh fruits and vegetables. Limit foods that are high in fat and sugar. These include fried or sweet foods. General instructions Return to your normal activities when your doctor says that it is safe. Keep all follow-up visits. Contact a doctor if: You feel like you may vomit (nauseous), or you vomit. You feel weak. You get light-headed when getting up from sitting or lying down. You are sweating for no reason. You have trouble pooping. You have worse breathing with physical activity. You have heaviness in your chest. Get help right away if: You faint. If this happens, do not drive yourself   to the hospital. You have a fast heartbeat, or a heartbeat that does not feel regular. Summary Iron deficiency anemia happens when you have too little iron in your body. This condition is treated by finding out why you do not have enough iron in your body and then getting more iron. Take over-the-counter and prescription medicines only as told by your doctor. Eat fresh fruits and vegetables  that are high in vitamin C. Contact a doctor if you have trouble pooping or feel weak. This information is not intended to replace advice given to you by your health care provider. Make sure you discuss any questions you have with your health care provider. Document Revised: 04/30/2021 Document Reviewed: 04/30/2021 Elsevier Patient Education  2023 [employer]  

## 2022-03-02 NOTE — Assessment & Plan Note (Signed)
Controlled on bisoprolol HCT Reinforced DASH diet and exercise for weight loss

## 2022-03-02 NOTE — Assessment & Plan Note (Signed)
We will check lipid panel with annual exam Encourage low-fat diet

## 2022-03-03 LAB — VITAMIN B12: Vitamin B-12: 504 pg/mL (ref 200–1100)

## 2022-03-03 LAB — IRON,TIBC AND FERRITIN PANEL
%SAT: 5 % (calc) — ABNORMAL LOW (ref 16–45)
Ferritin: 19 ng/mL (ref 16–232)
Iron: 17 ug/dL — ABNORMAL LOW (ref 45–160)
TIBC: 377 mcg/dL (calc) (ref 250–450)

## 2022-03-03 LAB — CBC WITH DIFFERENTIAL/PLATELET
Absolute Monocytes: 421 cells/uL (ref 200–950)
Basophils Absolute: 29 cells/uL (ref 0–200)
Basophils Relative: 0.6 %
Eosinophils Absolute: 123 cells/uL (ref 15–500)
Eosinophils Relative: 2.5 %
HCT: 35 % (ref 35.0–45.0)
Hemoglobin: 11 g/dL — ABNORMAL LOW (ref 11.7–15.5)
Lymphs Abs: 1441 cells/uL (ref 850–3900)
MCH: 24.2 pg — ABNORMAL LOW (ref 27.0–33.0)
MCHC: 31.4 g/dL — ABNORMAL LOW (ref 32.0–36.0)
MCV: 76.9 fL — ABNORMAL LOW (ref 80.0–100.0)
MPV: 12.3 fL (ref 7.5–12.5)
Monocytes Relative: 8.6 %
Neutro Abs: 2886 cells/uL (ref 1500–7800)
Neutrophils Relative %: 58.9 %
Platelets: 265 10*3/uL (ref 140–400)
RBC: 4.55 10*6/uL (ref 3.80–5.10)
RDW: 18.1 % — ABNORMAL HIGH (ref 11.0–15.0)
Total Lymphocyte: 29.4 %
WBC: 4.9 10*3/uL (ref 3.8–10.8)

## 2022-03-03 LAB — FOLATE: Folate: 16.4 ng/mL

## 2022-04-08 DIAGNOSIS — R051 Acute cough: Secondary | ICD-10-CM | POA: Diagnosis not present

## 2022-04-08 DIAGNOSIS — J069 Acute upper respiratory infection, unspecified: Secondary | ICD-10-CM | POA: Diagnosis not present

## 2022-04-09 ENCOUNTER — Encounter: Payer: Self-pay | Admitting: Surgery

## 2022-04-11 DIAGNOSIS — J069 Acute upper respiratory infection, unspecified: Secondary | ICD-10-CM | POA: Diagnosis not present

## 2022-04-26 ENCOUNTER — Ambulatory Visit (INDEPENDENT_AMBULATORY_CARE_PROVIDER_SITE_OTHER): Payer: BC Managed Care – PPO | Admitting: Dermatology

## 2022-04-26 VITALS — BP 111/61

## 2022-04-26 DIAGNOSIS — Z1283 Encounter for screening for malignant neoplasm of skin: Secondary | ICD-10-CM

## 2022-04-26 DIAGNOSIS — D229 Melanocytic nevi, unspecified: Secondary | ICD-10-CM

## 2022-04-26 DIAGNOSIS — L813 Cafe au lait spots: Secondary | ICD-10-CM

## 2022-04-26 DIAGNOSIS — L814 Other melanin hyperpigmentation: Secondary | ICD-10-CM

## 2022-04-26 DIAGNOSIS — L578 Other skin changes due to chronic exposure to nonionizing radiation: Secondary | ICD-10-CM

## 2022-04-26 DIAGNOSIS — D225 Melanocytic nevi of trunk: Secondary | ICD-10-CM | POA: Diagnosis not present

## 2022-04-26 DIAGNOSIS — L821 Other seborrheic keratosis: Secondary | ICD-10-CM

## 2022-04-26 NOTE — Patient Instructions (Addendum)
Cryotherapy Aftercare  Wash gently with soap and water everyday.   Apply Vaseline and Band-Aid daily until healed.   Discussed cosmetic procedure, noncovered.  $60 for 1st lesion and $15 for each additional lesion if done on the same day.  Maximum charge $350.  One touch-up treatment included no charge. Discussed risks of treatment including dyspigmentation, small scar, and/or recurrence. Recommend daily broad spectrum sunscreen SPF 30+/photoprotection to treated areas once healed.    Counseling for BBL / IPL / Laser and Coordination of Care Discussed the treatment option of Broad Band Light (BBL) Intense Pulsed Light (IPL) / Laser.  Typically we recommend at least 1-3 treatment sessions about 5-8 weeks apart for best results.  The patient's condition may require "maintenance treatments" in the future.  The fee for BBL / laser treatments is $350 per treatment session for the whole face.  A fee can be quoted for other parts of the body. Insurance typically does not pay for BBL/laser treatments and therefore the fee is an out-of-pocket cost.   Melanoma ABCDEs  Melanoma is the most dangerous type of skin cancer, and is the leading cause of death from skin disease.  You are more likely to develop melanoma if you: Have light-colored skin, light-colored eyes, or red or blond hair Spend a lot of time in the sun Tan regularly, either outdoors or in a tanning bed Have had blistering sunburns, especially during childhood Have a close family member who has had a melanoma Have atypical moles or large birthmarks  Early detection of melanoma is key since treatment is typically straightforward and cure rates are extremely high if we catch it early.   The first sign of melanoma is often a change in a mole or a new dark spot.  The ABCDE system is a way of remembering the signs of melanoma.  A for asymmetry:  The two halves do not match. B for border:  The edges of the growth are irregular. C for color:   A mixture of colors are present instead of an even brown color. D for diameter:  Melanomas are usually (but not always) greater than 64m - the size of a pencil eraser. E for evolution:  The spot keeps changing in size, shape, and color.  Please check your skin once per month between visits. You can use a small mirror in front and a large mirror behind you to keep an eye on the back side or your body.   If you see any new or changing lesions before your next follow-up, please call to schedule a visit.  Please continue daily skin protection including broad spectrum sunscreen SPF 30+ to sun-exposed areas, reapplying every 2 hours as needed when you're outdoors.   Staying in the shade or wearing long sleeves, sun glasses (UVA+UVB protection) and wide brim hats (4-inch brim around the entire circumference of the hat) are also recommended for sun protection.    Seborrheic Keratosis  What causes seborrheic keratoses? Seborrheic keratoses are harmless, common skin growths that first appear during adult life.  As time goes by, more growths appear.  Some people may develop a large number of them.  Seborrheic keratoses appear on both covered and uncovered body parts.  They are not caused by sunlight.  The tendency to develop seborrheic keratoses can be inherited.  They vary in color from skin-colored to gray, brown, or even black.  They can be either smooth or have a rough, warty surface.   Seborrheic keratoses are superficial and  look as if they were stuck on the skin.  Under the microscope this type of keratosis looks like layers upon layers of skin.  That is why at times the top layer may seem to fall off, but the rest of the growth remains and re-grows.    Treatment Seborrheic keratoses do not need to be treated, but can easily be removed in the office.  Seborrheic keratoses often cause symptoms when they rub on clothing or jewelry.  Lesions can be in the way of shaving.  If they become inflamed, they  can cause itching, soreness, or burning.  Removal of a seborrheic keratosis can be accomplished by freezing, burning, or surgery. If any spot bleeds, scabs, or grows rapidly, please return to have it checked, as these can be an indication of a skin cancer.   Due to recent changes in healthcare laws, you may see results of your pathology and/or laboratory studies on MyChart before the doctors have had a chance to review them. We understand that in some cases there may be results that are confusing or concerning to you. Please understand that not all results are received at the same time and often the doctors may need to interpret multiple results in order to provide you with the best plan of care or course of treatment. Therefore, we ask that you please give [place] 2 business days to thoroughly review all your results before contacting the office for clarification. Should we see a critical lab result, you will be contacted sooner.   If You Need Anything After Your Visit  If you have any questions or concerns for your doctor, please call our main line at [phone] and press option 4 to reach your doctor's medical assistant. If no one answers, please leave a voicemail as directed and we will return your call as soon as possible. Messages left after 4 pm will be answered the following business day.   You may also send [place] a message via [place]. We typically respond to MyChart messages within 1-2 business days.  For prescription refills, please ask your pharmacy to contact our office. Our fax number is [phone].  If you have an urgent issue when the clinic is closed that cannot wait until the next business day, you can page your doctor at the number below.    Please note that while we do our best to be available for urgent issues outside of office hours, we are not available 24/7.   If you have an urgent issue and are unable to reach [place], you may choose to seek medical care at your doctor's office,  retail clinic, urgent care center, or emergency room.  If you have a medical emergency, please immediately call 911 or go to the emergency department.  Pager Numbers  - Dr. Nehemiah Massed: [phone]  - Dr. Laurence Ferrari: [phone]  - Dr. Nicole Kindred: [phone]  In the event of inclement weather, please call our main line at [phone] for an update on the status of any delays or closures.  Dermatology Medication Tips: Please keep the boxes that topical medications come in in order to help keep track of the instructions about where and how to use these. Pharmacies typically print the medication instructions only on the boxes and not directly on the medication tubes.   If your medication is too expensive, please contact our office at [phone] option 4 or send [place] a message through [place].   We are unable to tell what your co-pay for medications will be in  advance as this is different depending on your insurance coverage. However, we may be able to find a substitute medication at lower cost or fill out paperwork to get insurance to cover a needed medication.   If a prior authorization is required to get your medication covered by your insurance company, please allow [place] 1-2 business days to complete this process.  Drug prices often vary depending on where the prescription is filled and some pharmacies may offer cheaper prices.  The website www.goodrx.com contains coupons for medications through different pharmacies. The prices here do not account for what the cost may be with help from insurance (it may be cheaper with your insurance), but the website can give you the price if you did not use any insurance.  - You can print the associated coupon and take it with your prescription to the pharmacy.  - You may also stop by our office during regular business hours and pick up a GoodRx coupon card.  - If you need your prescription sent electronically to a different pharmacy, notify our office through  [facility] or by phone at [phone] option 4.     Si Usted Necesita Algo Despus de Su Visita  Tambin puede enviarnos un mensaje a travs de Pharmacist, community. Por lo general respondemos a los mensajes de MyChart en el transcurso de 1 a 2 das hbiles.  Para renovar recetas, por favor pida a su farmacia que se ponga en contacto con nuestra oficina. Harland Dingwall de fax es [place] [phone].  Si tiene un asunto urgente cuando la clnica est cerrada y que no puede esperar hasta el siguiente da hbil, puede llamar/localizar a su doctor(a) al nmero que aparece a continuacin.   Por favor, tenga en cuenta que aunque hacemos todo lo posible para estar disponibles para asuntos urgentes fuera del horario de [place], no estamos disponibles las 24 horas del da, los 7 das de la [place].   Si tiene un problema urgente y no puede comunicarse con nosotros, puede optar por buscar atencin mdica  en el consultorio de su doctor(a), en una clnica privada, en un centro de atencin urgente o en una sala de emergencias.  Si tiene Engineering geologist, por favor llame inmediatamente al 911 o vaya a la sala de emergencias.  Nmeros de bper  - Dr. Nehemiah Massed: [phone]  - Dra. Moye: [phone]  - Dra. Nicole Kindred: [phone]  En caso de inclemencias del [place], por favor llame a Johnsie Kindred principal al [phone] para una actualizacin sobre el [place] de cualquier retraso o cierre.  Consejos para la medicacin en dermatologa: Por favor, guarde las cajas en las que vienen los medicamentos de uso tpico para ayudarle a seguir las instrucciones sobre dnde y cmo usarlos. Las farmacias generalmente imprimen las instrucciones del medicamento slo en las cajas y no directamente en los tubos del [place].   Si su medicamento es muy caro, por favor, pngase en contacto con Zigmund Daniel llamando al [phone] y presione la opcin 4 o envenos un mensaje a travs de Pharmacist, community.   No podemos  decirle cul ser su copago por los medicamentos por adelantado ya que esto es diferente dependiendo de la cobertura de su seguro. Sin embargo, es posible que podamos encontrar un medicamento sustituto a Electrical engineer un formulario para que el seguro cubra el medicamento que se considera necesario.   Si se requiere una autorizacin previa para que su compaa de seguros [place] su medicamento, por favor permtanos de 1 a 2  das hbiles para completar [place].  Los precios de los medicamentos varan con frecuencia dependiendo del Environmental consultant de dnde se surte la receta y alguna farmacias pueden ofrecer precios ms baratos.  El sitio web www.goodrx.com tiene cupones para medicamentos de Airline pilot. Los precios aqu no tienen en cuenta lo que podra costar con la ayuda del seguro (puede ser ms barato con su seguro), pero el sitio web puede darle el precio si no utiliz Research scientist (physical sciences).  - Puede imprimir el cupn correspondiente y llevarlo con su receta a la farmacia.  - Tambin puede pasar por nuestra oficina durante el horario de atencin regular y Charity fundraiser una tarjeta de cupones de GoodRx.  - Si necesita que su receta se enve electrnicamente a una farmacia diferente, informe a nuestra oficina a travs de MyChart de [place] o por telfono llamando al [phone] y presione la opcin 4.

## 2022-04-26 NOTE — Progress Notes (Signed)
New Patient Visit  Subjective  Kristen Macdonald is a 52 y.o. female who presents for the following: Skin check.  The patient presents for Total-Body Skin Exam (TBSE) for skin cancer screening and mole check.  The patient has spots, moles and lesions to be evaluated, some may be new or changing. She has a few dark spots on her face she is concerned about.    The following portions of the chart were reviewed this encounter and updated as appropriate:       Review of Systems:  No other skin or systemic complaints except as noted in HPI or Assessment and Plan.  Objective  Well appearing patient in no apparent distress; mood and affect are within normal limits.  A full examination was performed including scalp, head, eyes, ears, nose, lips, neck, chest, axillae, abdomen, back, buttocks, bilateral upper extremities, bilateral lower extremities, hands, feet, fingers, toes, fingernails, and toenails. All findings within normal limits unless otherwise noted below.  Right Abdomen 4.0 x 2.0 mm brown macule  Left Lower Sternum 5.0 mm flesh papule  Right Malar Cheek Waxy tan papule.       Assessment & Plan  Skin cancer screening performed today.  Actinic Damage - chronic, secondary to cumulative UV radiation exposure/sun exposure over time - diffuse scaly erythematous macules with underlying dyspigmentation - Recommend daily broad spectrum sunscreen SPF 30+ to sun-exposed areas, reapply every 2 hours as needed.  - Recommend staying in the shade or wearing long sleeves, sun glasses (UVA+UVB protection) and wide brim hats (4-inch brim around the entire circumference of the hat). - Call for new or changing lesions.  Lentigines - Scattered tan macules - Due to sun exposure - Benign-appearing, observe - Recommend daily broad spectrum sunscreen SPF 30+ to sun-exposed areas, reapply every 2 hours as needed. - Call for any changes Counseling for BBL / IPL / Laser and Coordination of Care  for face Discussed the treatment option of Broad Band Light (BBL) Intense Pulsed Light (IPL) / Laser.  Typically we recommend at least 1-3 treatment sessions about 5-8 weeks apart for best results.  The patient's condition may require "maintenance treatments" in the future.  The fee for BBL / laser treatments is $350 per treatment session for the whole face.  A fee can be quoted for other parts of the body. Insurance typically does not pay for BBL/laser treatments and therefore the fee is an out-of-pocket cost.  Seborrheic Keratoses - Stuck-on, waxy, tan-brown papules and/or plaques  - Benign-appearing - Discussed benign etiology and prognosis. - Observe - Call for any changes  Nevus (2) Left Lower Sternum; Right Abdomen  Benign-appearing.  Observation.  Call clinic for new or changing moles.  Recommend daily use of broad spectrum spf 30+ sunscreen to sun-exposed areas.   Seborrheic keratosis Right Malar Cheek  Discussed cosmetic procedure, noncovered.  $60 for 1st lesion and $15 for each additional lesion if done on the same day.  Maximum charge $350.  One touch-up treatment included no charge. Discussed risks of treatment including dyspigmentation, small scar, and/or recurrence. Recommend daily broad spectrum sunscreen SPF 30+/photoprotection to treated areas once healed.  Cryotherapy today x 1, $60 noncovered  Destruction of lesion - Right Malar Cheek  Destruction method: cryotherapy   Informed consent: discussed and consent obtained   Lesion destroyed using liquid nitrogen: Yes   Region frozen until ice ball extended beyond lesion: Yes   Outcome: patient tolerated procedure well with no complications   Post-procedure details: wound  care instructions given   Additional details:  Prior to procedure, discussed risks of blister formation, small wound, skin dyspigmentation, or rare scar following cryotherapy. Recommend Vaseline ointment to treated areas while healing.    Melanocytic  Nevi - Tan-brown and/or pink-flesh-colored symmetric macules and papules - Benign appearing on exam today - Observation - Call clinic for new or changing moles - Recommend daily use of broad spectrum spf 30+ sunscreen to sun-exposed areas.   Cafe au Lait  - Tan patch of the left posterior thigh - Genetic - Benign, observe - Call for any changes  Return in about 2 months (around 06/25/2022) for cosmetic sk.  IJamesetta Orleans, CMA, am acting as scribe for Brendolyn Patty, MD .  Documentation: I have reviewed the above documentation for accuracy and completeness, and I agree with the above.  Brendolyn Patty MD

## 2022-05-02 DIAGNOSIS — M7989 Other specified soft tissue disorders: Secondary | ICD-10-CM | POA: Diagnosis not present

## 2022-05-02 DIAGNOSIS — S93401A Sprain of unspecified ligament of right ankle, initial encounter: Secondary | ICD-10-CM | POA: Diagnosis not present

## 2022-05-02 DIAGNOSIS — S99911A Unspecified injury of right ankle, initial encounter: Secondary | ICD-10-CM | POA: Diagnosis not present

## 2022-05-02 DIAGNOSIS — S99921A Unspecified injury of right foot, initial encounter: Secondary | ICD-10-CM | POA: Diagnosis not present

## 2022-05-19 ENCOUNTER — Encounter: Payer: Self-pay | Admitting: Surgery

## 2022-05-19 ENCOUNTER — Ambulatory Visit (INDEPENDENT_AMBULATORY_CARE_PROVIDER_SITE_OTHER): Payer: BC Managed Care – PPO | Admitting: Surgery

## 2022-05-19 VITALS — BP 122/80 | HR 58 | Temp 97.7°F | Ht 63.0 in | Wt 153.4 lb

## 2022-05-19 DIAGNOSIS — K449 Diaphragmatic hernia without obstruction or gangrene: Secondary | ICD-10-CM

## 2022-05-19 DIAGNOSIS — Z09 Encounter for follow-up examination after completed treatment for conditions other than malignant neoplasm: Secondary | ICD-10-CM | POA: Diagnosis not present

## 2022-05-19 DIAGNOSIS — Z8719 Personal history of other diseases of the digestive system: Secondary | ICD-10-CM

## 2022-05-19 NOTE — Patient Instructions (Signed)
If you have any concerns or questions, please feel free to call our office. Follow up as needed.   Diarrhea, Adult Diarrhea is when you pass loose and sometimes watery poop (stool) often. Diarrhea can make you feel weak and cause you to lose water in your body (get dehydrated). Losing water in your body can cause you to: Feel tired and thirsty. Have a dry mouth. Go pee (urinate) less often. Diarrhea often lasts 2-3 days. It can last longer if it is a sign of something more serious. Be sure to treat your diarrhea as told by your doctor. Follow these instructions at home: Eating and drinking     Follow these instructions as told by your doctor: Take an ORS (oral rehydration solution). This is a drink that helps you replace fluids and minerals your body lost. It is sold at pharmacies and stores. Drink enough fluid to keep your pee (urine) pale yellow. Drink fluids such as: Water. You can also get fluids by sucking on ice chips. Diluted fruit juice. Low-calorie sports drinks. Milk. Avoid drinking fluids that have a lot of sugar or caffeine in them. These include soda, energy drinks, and regular sports drinks. Avoid alcohol. Eat bland, easy-to-digest foods in small amounts as you are able. These foods include: Bananas. Applesauce. Rice. Low-fat (lean) meats. Toast. Crackers. Avoid spicy or fatty foods.  Medicines Take over-the-counter and prescription medicines only as told by your doctor. If you were prescribed antibiotics, take them as told by your doctor. Do not stop taking them even if you start to feel better. General instructions  Wash your hands often using soap and water for 20 seconds. If soap and water are not available, use hand sanitizer. Others in your home should wash their hands as well. Wash your hands: After using the toilet or changing a diaper. Before preparing, cooking, or serving food. While caring for a sick person. While visiting someone in a hospital. Rest  at home while you get better. Take a warm bath to help with any burning or pain from having diarrhea. Watch your condition for any changes. Contact a doctor if: You have a fever. Your diarrhea gets worse. You have new symptoms. You vomit every time you eat or drink. You feel light-headed, dizzy, or you have a headache. You have muscle cramps. You have signs of losing too much water in your body, such as: Dark pee, very little pee, or no pee. Cracked lips. Dry mouth. Sunken eyes. Sleepiness. Weakness. You have bloody or black poop or poop that looks like tar. You have very bad pain, cramping, or bloating in your belly (abdomen). Your skin feels cold and clammy. You feel confused. Get help right away if: You have chest pain. Your heart is beating very quickly. You have trouble breathing or you are breathing very quickly. You feel very weak or you faint. These symptoms may be an emergency. Get help right away. Call 911. Do not wait to see if the symptoms will go away. Do not drive yourself to the hospital. This information is not intended to replace advice given to you by your health care provider. Make sure you discuss any questions you have with your health care provider. Document Revised: 09/08/2021 Document Reviewed: 09/08/2021 Elsevier Patient Education  [place].

## 2022-05-21 NOTE — Progress Notes (Signed)
Outpatient Surgical Follow Up  05/21/2022  Kristen Macdonald is an 52 y.o. female.   Chief Complaint  Patient presents with   Follow-up    Paraesophageal hernia repair 02/11/22    HPI: This is following 3 months after [place] for the consultation and repair paraesophageal hernia.  She has done very well.  No fevers no chills no reflux she is very happy with results.  Not using any PPI.  Past Medical History:  Diagnosis Date   GERD (gastroesophageal reflux disease)    Hyperlipidemia    Hypertension     Past Surgical History:  Procedure Laterality Date   INSERTION OF MESH  02/11/2022   Procedure: INSERTION OF MESH;  Surgeon: Jules Husbands, MD;  Location: [facility] ORS;  Service: General;;   TONSILLECTOMY     WISDOM TOOTH EXTRACTION     XI ROBOTIC ASSISTED PARAESOPHAGEAL HERNIA REPAIR N/A 02/11/2022   Procedure: XI ROBOTIC ASSISTED PARAESOPHAGEAL HERNIA REPAIR, RNFA to assist;  Surgeon: Jules Husbands, MD;  Location: [facility] ORS;  Service: General;  Laterality: N/A;    Family History  Problem Relation Age of Onset   Breast cancer Mother    Hypertension Mother    Anxiety disorder Mother    Heart disease Father     Social History:  reports that she has never smoked. She has never used smokeless tobacco. She reports current alcohol use. She reports that she does not use drugs.  Allergies:  Allergies  Allergen Reactions   Codeine Nausea Only    Medications reviewed.    ROS Full ROS performed and is otherwise negative other than what is stated in HPI   BP 122/80   Pulse (!) 58   Temp 97.7 F (36.5 C) (Oral)   Ht 5' 3"$  (1.6 m)   Wt 153 lb 6.4 oz (69.6 kg)   SpO2 97%   BMI 27.17 kg/m   Physical Exam Vitals and nursing note reviewed. Exam conducted with a chaperone present.  Constitutional:      General: She is not in acute distress.    Appearance: Normal appearance. She is normal weight.  Pulmonary:     Effort: Pulmonary effort is normal. No respiratory distress.      Breath sounds: Normal breath sounds. No stridor. No wheezing.  Abdominal:     General: Abdomen is flat. There is no distension.     Palpations: Abdomen is soft. There is no mass.     Tenderness: There is no abdominal tenderness. There is no guarding.     Hernia: No hernia is present.  Skin:    General: Skin is warm and dry.     Capillary Refill: Capillary refill takes less than 2 seconds.  Neurological:     General: No focal deficit present.     Mental Status: She is alert and oriented to person, place, and time.  Psychiatric:        Mood and Affect: Mood normal.        Behavior: Behavior normal.        Thought Content: Thought content normal.        Judgment: Judgment normal.    Assessment/Plan: Shawniqua doing very well after paraesophageal hernia repair.  She has no complications and is doing phenomenally. RTC prn Please note I spent 20 min in this encounter including personally reviewing imaging studies, counseling the patient and performing appropriate documentation  Caroleen Hamman, MD [place] Surgeon

## 2022-06-15 ENCOUNTER — Ambulatory Visit: Payer: BC Managed Care – PPO | Admitting: Dermatology

## 2022-08-04 DIAGNOSIS — L247 Irritant contact dermatitis due to plants, except food: Secondary | ICD-10-CM | POA: Diagnosis not present

## 2022-08-09 DIAGNOSIS — M25571 Pain in right ankle and joints of right foot: Secondary | ICD-10-CM | POA: Diagnosis not present

## 2022-08-24 DIAGNOSIS — M25571 Pain in right ankle and joints of right foot: Secondary | ICD-10-CM | POA: Diagnosis not present

## 2022-08-24 DIAGNOSIS — R262 Difficulty in walking, not elsewhere classified: Secondary | ICD-10-CM | POA: Diagnosis not present

## 2022-09-01 DIAGNOSIS — R262 Difficulty in walking, not elsewhere classified: Secondary | ICD-10-CM | POA: Diagnosis not present

## 2022-09-01 DIAGNOSIS — M25571 Pain in right ankle and joints of right foot: Secondary | ICD-10-CM | POA: Diagnosis not present

## 2022-09-07 DIAGNOSIS — R262 Difficulty in walking, not elsewhere classified: Secondary | ICD-10-CM | POA: Diagnosis not present

## 2022-09-07 DIAGNOSIS — M25571 Pain in right ankle and joints of right foot: Secondary | ICD-10-CM | POA: Diagnosis not present

## 2022-09-09 DIAGNOSIS — R262 Difficulty in walking, not elsewhere classified: Secondary | ICD-10-CM | POA: Diagnosis not present

## 2022-09-09 DIAGNOSIS — M25571 Pain in right ankle and joints of right foot: Secondary | ICD-10-CM | POA: Diagnosis not present

## 2022-09-14 DIAGNOSIS — M25571 Pain in right ankle and joints of right foot: Secondary | ICD-10-CM | POA: Diagnosis not present

## 2022-09-14 DIAGNOSIS — R262 Difficulty in walking, not elsewhere classified: Secondary | ICD-10-CM | POA: Diagnosis not present

## 2022-09-16 DIAGNOSIS — R262 Difficulty in walking, not elsewhere classified: Secondary | ICD-10-CM | POA: Diagnosis not present

## 2022-09-16 DIAGNOSIS — M25571 Pain in right ankle and joints of right foot: Secondary | ICD-10-CM | POA: Diagnosis not present

## 2022-11-15 ENCOUNTER — Ambulatory Visit
Admission: EM | Admit: 2022-11-15 | Discharge: 2022-11-15 | Disposition: A | Discharge status: Home / Self Care | Payer: BC Managed Care – PPO | Attending: Emergency Medicine | Admitting: Emergency Medicine

## 2022-11-15 DIAGNOSIS — R21 Rash and other nonspecific skin eruption: Secondary | ICD-10-CM

## 2022-11-15 MED ORDER — METHYLPREDNISOLONE ACETATE 80 MG/ML IJ SUSP
60.0000 mg | Freq: Once | INTRAMUSCULAR | Status: AC
  Administered 2022-11-15: 60 mg via INTRAMUSCULAR

## 2022-11-15 MED ORDER — PREDNISONE 10 MG (21) PO TBPK: ORAL_TABLET | Freq: Every day | ORAL | 0 refills | Status: AC

## 2022-11-15 NOTE — Discharge Instructions (Addendum)
The cause of your rash today is unknown, therefore I will treat the rash with efforts to reduce spread and to clear current lesions.   Rash is most consistent with inflammatory process and there are no current signs of infection  You have been given an injection of steroids here today in the office, daily will see improvement in about 30 minutes to an hour  Starting tomorrow take prednisone every morning as directed, take with food  You may continue use of topical Benadryl, calamine lotion as well as oatmeal baths to help soothe the skin and reduce itching, may also take oral Benadryl if itching is severe  Avoid long exposure to heat such as when is outside or in the shower as this can cause further irritation to your skin  If your symptoms continue to persist or worsen you may follow-up with his urgent care as needed for reevaluation

## 2022-11-15 NOTE — ED Provider Notes (Signed)
[facility]    CSN: [id number] Arrival date & time: 11/15/22  0849      History   Chief Complaint Chief Complaint  Patient presents with   Rash    HPI Kristen Macdonald is a 52 y.o. female.   Patient presents for evaluation of a rash to the bilateral arms present for 7 days, has begun to spread to the chest and the neck beginning this morning.  Rash is erythematous and pruritic appearing almost as burn marks and hives.  Has not occurred before.  Approximately 3 weeks ago had a rash appear as stripes on the arms after working in the yard, cleared with use of topical medications, unsure if related.  Has attempted use of topical Benadryl and hydrocortisone over the arms and seen improvement but did not fully resolve.  Has had poison ivy before and endorses this is not the typical presentation.  Denies presence of drainage or fever.  Denies changes in toiletries, diet, medications or recent travel.  No other member of household or close contact has similar symptoms.     Past Medical History:  Diagnosis Date   GERD (gastroesophageal reflux disease)    Hyperlipidemia    Hypertension     Patient Active Problem List   Diagnosis Date Noted   Iron deficiency anemia 03/02/2022   Gastroesophageal reflux disease without esophagitis 01/19/2018   Psychophysiological insomnia 01/19/2018   Essential hypertension 01/19/2018   Pure hypercholesterolemia 01/19/2018   Anxiety 01/19/2018    Past Surgical History:  Procedure Laterality Date   INSERTION OF MESH  02/11/2022   Procedure: INSERTION OF MESH;  Surgeon: Leafy Ro, MD;  Location: [facility] OR[facility];  Service: General;;   TONSILLECTOMY     WISDOM TOOTH EXTRACTION     XI ROBOTIC ASSISTED PARAESOPHAGEAL HERNIA REPAIR N/A 02/11/2022   Procedure: XI ROBOTIC ASSISTED PARAESOPHAGEAL HERNIA REPAIR, RNFA to assist;  Surgeon: Leafy Ro, MD;  Location: [facility];  Service: General;  Laterality: N/A;    OB History   No obstetric history  on file.      Home Medications    Prior to Admission medications   Medication Sig Start Date End Date Taking? Authorizing Provider  bisoprolol-hydrochlorothiazide (ZIAC) 5-6.25 MG tablet Take 1 tablet by mouth daily. 01/11/18  Yes [provider]  ferrous sulfate (FERROUSUL) 325 (65 FE) MG tablet Take 1 tablet (325 mg total) by mouth daily with breakfast. 02/12/22 05/19/22  Donovan Kail, PA-C    Family History Family History  Problem Relation Age of Onset   Breast cancer Mother    Hypertension Mother    Anxiety disorder Mother    Heart disease Father     Social History Social History   Tobacco Use   Smoking status: Never   Smokeless tobacco: Never  Substance Use Topics   Alcohol use: Yes    Comment: occasional   Drug use: Never     Allergies   Codeine   Review of Systems Review of Systems  Constitutional: Negative.   HENT: Negative.    Respiratory: Negative.    Cardiovascular: Negative.   Musculoskeletal: Negative.   Skin:  Positive for rash. Negative for color change, pallor and wound.     Physical Exam Triage Vital Signs ED Triage Vitals  Encounter Vitals Group     BP 11/15/22 0938 (!) 145/88     Systolic BP Percentile --      Diastolic BP Percentile --      Pulse Rate  11/15/22 0938 (!) 51     Resp 11/15/22 0938 18     Temp 11/15/22 0938 98 F (36.7 C)     Temp Source 11/15/22 0938 Oral     SpO2 11/15/22 0938 97 %     Weight 11/15/22 0937 150 lb (68 kg)     Height --      Head Circumference --      Peak Flow --      Pain Score 11/15/22 0937 0     Pain Loc --      Pain Education --      Exclude from Growth Chart --    No data found.  Updated Vital Signs BP (!) 145/88 (BP Location: Right Arm)   Pulse (!) 51   Temp 98 F (36.7 C) (Oral)   Resp 18   Wt 150 lb (68 kg)   SpO2 97%   BMI 26.57 kg/m   Visual Acuity Right Eye Distance:   Left Eye Distance:   Bilateral Distance:    Right Eye Near:   Left Eye Near:     Bilateral Near:     Physical Exam Constitutional:      Appearance: Normal appearance.  Eyes:     Extraocular Movements: Extraocular movements intact.  Pulmonary:     Effort: Pulmonary effort is normal.  Skin:    Comments: Erythematous maculopapular rash present to the bilateral anterior arms, anterior chest and anterior and lateral aspects of the neck  Neurological:     Mental Status: She is alert and oriented to person, place, and time. Mental status is at baseline.      UC Treatments / Results  Labs (all labs ordered are listed, but only abnormal results are displayed) Labs Reviewed - No data to display  EKG   Radiology No results found.  Procedures Procedures (including critical care time)  Medications Ordered in UC Medications - No data to display  Initial Impression / Assessment and Plan / UC Course  I have reviewed the triage vital signs and the nursing notes.  Pertinent labs & imaging results that were available during my care of the patient were reviewed by me and considered in my medical decision making (see chart for details).  Rash   Cause unknown, appears to be a contact dermatitis, inflammatory in nature without signs of infection, discussed with patient, methylprednisolone injection given in office and prednisone taper prescribed for outpatient use, recommended continued use of topical antihistamines and hydrocortisone for management of pruritus, may also use oral antihistamines for severe pruritus, recommended avoidance of heat to prevent further irritation advised follow-up if symptoms persist or worsen Final Clinical Impressions(s) / UC Diagnoses   Final diagnoses:  None   Discharge Instructions   None    ED Prescriptions   None    PDMP not reviewed this encounter.   Valinda Hoar, NP 11/15/22 1009

## 2022-11-15 NOTE — ED Triage Notes (Signed)
Rash on both arms neck and chest. X 1 week.

## 2022-11-21 DIAGNOSIS — B359 Dermatophytosis, unspecified: Secondary | ICD-10-CM | POA: Diagnosis not present

## 2022-11-24 ENCOUNTER — Ambulatory Visit: Payer: Self-pay | Admitting: *Deleted

## 2022-11-24 DIAGNOSIS — Z885 Allergy status to narcotic agent status: Secondary | ICD-10-CM | POA: Diagnosis not present

## 2022-11-24 DIAGNOSIS — I10 Essential (primary) hypertension: Secondary | ICD-10-CM | POA: Diagnosis not present

## 2022-11-24 DIAGNOSIS — B354 Tinea corporis: Secondary | ICD-10-CM | POA: Diagnosis not present

## 2022-11-24 DIAGNOSIS — R21 Rash and other nonspecific skin eruption: Secondary | ICD-10-CM | POA: Diagnosis not present

## 2022-11-24 NOTE — Telephone Encounter (Signed)
  Chief Complaint: [place] with [place] worm at the beach at [facility] . Rash spreading to face UC requesting patient be seen by PCP to decrease spreading in blood stream. Symptoms: rash/ tiny blisters spreading to face. Has been on prednisone over a week. And helping but blisters spreading. Frequency: 11/15/22 Pertinent Negatives: Patient denies fever.  Disposition: [x] ED /[] Urgent Care (no appt availability in office) / [] Appointment(In office/virtual)/ []  [facility]/ [] Home Care/ [] Refused Recommended Disposition /[]  Mobile Bus/ []  Follow-up with PCP Additional Notes:   No available appt until after 11/29/22. Recommended ED due to no appt.  Please advise if patient can be worked in for appt today or if ok to be seen at a later date. Patient would like a call back today.     Reason for Disposition  [1] On treatment > 1 week AND [2] rash continues to spread  Answer Assessment - Initial Assessment Questions 1. APPEARANCE of RASH: "What does the rash look like?"      Little blisters  2. LOCATION: "Where is the rash located?"      16 blisters now 3 on left side of face  3. SIZE: "How large are the spots?"      little 4. NUMBER: "How many spots are there?"      16 all together 5. ONSET: "When did the ringworm start?"     11/15/22 6. OTHER SYMPTOMS: "Do you have any other symptoms?" (e.g., fever, headache, etc.)     No but rash spreading and UC requesting f/u with PCP 7. PREGNANCY: "Is there any chance you are pregnant?" "When was your last menstrual period?"     na  Protocols used: Ringworm-A-AH

## 2022-11-24 NOTE — Telephone Encounter (Signed)
Tried calling twice.  It goes directly into voicemail and she does not have it set up.  Please advise pt that we do not have opening today.  She will need to go to [facility] or ED to be seen today/tonight.    Thanks,   -Vernona Rieger

## 2022-11-25 NOTE — Telephone Encounter (Signed)
Pt went to ED.     Thanks,   -Vernona Rieger

## 2023-03-17 DIAGNOSIS — Z124 Encounter for screening for malignant neoplasm of cervix: Secondary | ICD-10-CM | POA: Diagnosis not present

## 2023-03-17 DIAGNOSIS — Z01411 Encounter for gynecological examination (general) (routine) with abnormal findings: Secondary | ICD-10-CM | POA: Diagnosis not present

## 2023-03-17 DIAGNOSIS — Z1331 Encounter for screening for depression: Secondary | ICD-10-CM | POA: Diagnosis not present

## 2023-03-21 DIAGNOSIS — Z0189 Encounter for other specified special examinations: Secondary | ICD-10-CM | POA: Diagnosis not present

## 2023-03-21 DIAGNOSIS — Z13220 Encounter for screening for lipoid disorders: Secondary | ICD-10-CM | POA: Diagnosis not present

## 2023-03-23 DIAGNOSIS — Z1231 Encounter for screening mammogram for malignant neoplasm of breast: Secondary | ICD-10-CM | POA: Diagnosis not present

## 2023-05-02 ENCOUNTER — Ambulatory Visit: Payer: BC Managed Care – PPO | Admitting: Dermatology

## 2023-08-09 DIAGNOSIS — R21 Rash and other nonspecific skin eruption: Secondary | ICD-10-CM | POA: Diagnosis not present

## 2023-09-28 DIAGNOSIS — D225 Melanocytic nevi of trunk: Secondary | ICD-10-CM | POA: Diagnosis not present

## 2023-09-28 DIAGNOSIS — D2262 Melanocytic nevi of left upper limb, including shoulder: Secondary | ICD-10-CM | POA: Diagnosis not present

## 2023-09-28 DIAGNOSIS — D2272 Melanocytic nevi of left lower limb, including hip: Secondary | ICD-10-CM | POA: Diagnosis not present

## 2023-09-28 DIAGNOSIS — D2261 Melanocytic nevi of right upper limb, including shoulder: Secondary | ICD-10-CM | POA: Diagnosis not present

## 2023-10-25 DIAGNOSIS — L247 Irritant contact dermatitis due to plants, except food: Secondary | ICD-10-CM | POA: Diagnosis not present

## 2023-11-14 ENCOUNTER — Telehealth: Payer: Self-pay

## 2023-11-14 ENCOUNTER — Telehealth (INDEPENDENT_AMBULATORY_CARE_PROVIDER_SITE_OTHER): Admitting: Internal Medicine

## 2023-11-14 ENCOUNTER — Encounter: Payer: Self-pay | Admitting: Internal Medicine

## 2023-11-14 DIAGNOSIS — F40243 Fear of flying: Secondary | ICD-10-CM

## 2023-11-14 MED ORDER — ALPRAZOLAM 0.25 MG PO TABS: 0.2500 mg | ORAL_TABLET | Freq: Every day | ORAL | 0 refills | Status: AC | PRN

## 2023-11-14 NOTE — Patient Instructions (Signed)
 Managing Anxiety, Adult  After being diagnosed with anxiety, you may be relieved to know why you have felt or behaved a certain way. You may also feel overwhelmed about the treatment ahead and what it will mean for your life. With care and support, you can manage your anxiety.  How to manage lifestyle changes  Understanding the difference between stress and anxiety  Although stress can play a role in anxiety, it is not the same as anxiety. Stress is your body's reaction to life changes and events, both good and bad. Stress is often caused by something external, such as a deadline, test, or competition. It normally goes away after the event has ended and will last just a few hours. But, stress can be ongoing and can lead to more than just stress.  Anxiety is caused by something internal, such as imagining a terrible outcome or worrying that something will go wrong that will greatly upset you. Anxiety often does not go away even after the event is over, and it can become a long-term (chronic) worry.  Lowering stress and anxiety    Talk with your health care provider or a counselor to learn more about lowering anxiety and stress. They may suggest tension-reduction techniques, such as:  Music. Spend time creating or listening to music that you enjoy and that inspires you.  Mindfulness-based meditation. Practice being aware of your normal breaths while not trying to control your breathing. It can be done while sitting or walking.  Centering prayer. Focus on a word, phrase, or sacred image that means something to you and brings you peace.  Deep breathing. Expand your stomach and inhale slowly through your nose. Hold your breath for 3-5 seconds. Then breathe out slowly, letting your stomach muscles relax.  Self-talk. Learn to notice and spot thought patterns that lead to anxiety reactions. Change those patterns to thoughts that feel peaceful.  Muscle relaxation. Take time to tense muscles and then relax them.  Choose a  tension-reduction technique that fits your lifestyle and personality. These techniques take time and practice. Set aside 5-15 minutes a day to do them. Specialized therapists can offer counseling and training in these techniques. The training to help with anxiety may be covered by some insurance plans.  Other things you can do to manage stress and anxiety include:  Keeping a stress diary. This can help you learn what triggers your reaction and then learn ways to manage your response.  Thinking about how you react to certain situations. You may not be able to control everything, but you can control your response.  Making time for activities that help you relax and not feeling guilty about spending your time in this way.  Doing visual imagery. This involves imagining or creating mental pictures to help you relax.  Practicing yoga. Through yoga poses, you can lower tension and relax.     Medicines  Medicines for anxiety include:  Antidepressant medicines. These are usually prescribed for long-term daily control.  Anti-anxiety medicines. These may be added in severe cases, especially when panic attacks occur.  When used together, medicines, psychotherapy, and tension-reduction techniques may be the most effective treatment.  Relationships  Relationships can play a big part in helping you recover. Spend more time connecting with trusted friends and family members. Think about going to couples counseling if you have a partner, taking family education classes, or going to family therapy. Therapy can help you and others better understand your anxiety.  How to recognize changes in  your anxiety  Everyone responds differently to treatment for anxiety. Recovery from anxiety happens when symptoms lessen and stop interfering with your daily life at home or work. This may mean that you will start to:  Have better concentration and focus. Worry will interfere less in your daily thinking.  Sleep better.  Be less irritable.  Have  more energy.  Have improved memory.  Try to recognize when your condition is getting worse. Contact your provider if your symptoms interfere with home or work and you feel like your condition is not improving.  Follow these instructions at home:  Activity  Exercise. Adults should:  Exercise for at least 150 minutes each week. The exercise should increase your heart rate and make you sweat (moderate-intensity exercise).  Do strengthening exercises at least twice a week.  Get the right amount and quality of sleep. Most adults need 7-9 hours of sleep each night.  Lifestyle    Eat a healthy diet that includes plenty of vegetables, fruits, whole grains, low-fat dairy products, and lean protein.  Do not eat a lot of foods that are high in fats, added sugars, or salt (sodium).  Make choices that simplify your life.  Do not use any products that contain nicotine or tobacco. These products include cigarettes, chewing tobacco, and vaping devices, such as e-cigarettes. If you need help quitting, ask your provider.  Avoid caffeine, alcohol, and certain over-the-counter cold medicines. These may make you feel worse. Ask your pharmacist which medicines to avoid.  General instructions  Take over-the-counter and prescription medicines only as told by your provider.  Keep all follow-up visits. This is to make sure you are managing your anxiety well or if you need more support.  Where to find support  You can get help and support from:  Self-help groups.  Online and [employer].  A trusted spiritual leader.  Couples counseling.  Family education classes.  Family therapy.  Where to find more information  You may find that joining a support group helps you deal with your anxiety. The following sources can help you find counselors or support groups near you:  Mental Health America: mentalhealthamerica.net  Anxiety and Depression Association of [place] (ADAA): adaa.org  The [employer] on Mental Illness (NAMI):  nami.org  Contact a health care provider if:  You have a hard time staying focused or finishing tasks.  You spend many hours a day feeling worried about everyday life.  You are very tired because you cannot stop worrying.  You start to have headaches or often feel tense.  You have chronic nausea or diarrhea.  Get help right away if:  Your heart feels like it is racing.  You have shortness of breath.  You have thoughts of hurting yourself or others.  Get help right away if you feel like you may hurt yourself or others, or have thoughts about taking your own life. Go to your nearest emergency room or:  Call 911.  Call the National Suicide Prevention Lifeline at (562)309-0957 or 988. This is open 24 hours a day.  Text the Crisis Text Line at 786-142-3811.  This information is not intended to replace advice given to you by your health care provider. Make sure you discuss any questions you have with your health care provider.  Document Revised: 12/29/2021 Document Reviewed: 07/13/2020  Elsevier Patient Education  2024 [employer].
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
## 2023-11-14 NOTE — Telephone Encounter (Signed)
 Spoke with patient, appointment scheduled

## 2023-11-14 NOTE — Telephone Encounter (Signed)
 Copied from CRM #[id number]. Topic: Clinical - Medication Question >> Nov 14, 2023  9:46 AM Wess RAMAN wrote: Reason for CRM: Patient states she will be flying on 8/13 and would like medication to calm her on her flight  Callback #: [phone]  Preferred Pharmacy: [id number] [place], [place] - [address] [place] [address] Phone: [phone] Fax: [phone] Hours: Not open 24 hours

## 2023-11-14 NOTE — Telephone Encounter (Signed)
 I have not seen this patient in 2 years.  She can schedule an appointment to discuss

## 2024-02-14 ENCOUNTER — Telehealth: Payer: Self-pay

## 2024-02-14 NOTE — Telephone Encounter (Signed)
 Copied from CRM [id number]. Topic: Clinical - Lab/Test Results >> Feb 14, 2024  3:41 PM Tiffini S wrote: Reason for CRM: Patient called to schedule labs on 03/19/24 and a physical appointment on 04/16/24- please submit the lab orders for the appointment

## 2024-02-15 NOTE — Telephone Encounter (Signed)
 I do not order labs prior to the appointment.  Typically, I do not require fasting however if she would like to fast prior to her appointment, she only needs to fast for 4 hours.

## 2024-02-15 NOTE — Telephone Encounter (Signed)
 Spoke with patient, explained to her that Kristen Macdonald does not require fasting labs but she can if she would like to not eat 4 hour prior to appointment, lab appointment cancelled.

## 2024-03-19 ENCOUNTER — Other Ambulatory Visit

## 2024-03-19 DIAGNOSIS — K529 Noninfective gastroenteritis and colitis, unspecified: Secondary | ICD-10-CM | POA: Diagnosis not present

## 2024-03-19 DIAGNOSIS — Z01411 Encounter for gynecological examination (general) (routine) with abnormal findings: Secondary | ICD-10-CM | POA: Diagnosis not present

## 2024-03-19 DIAGNOSIS — M810 Age-related osteoporosis without current pathological fracture: Secondary | ICD-10-CM | POA: Diagnosis not present

## 2024-03-19 DIAGNOSIS — Z1331 Encounter for screening for depression: Secondary | ICD-10-CM | POA: Diagnosis not present

## 2024-03-19 DIAGNOSIS — I10 Essential (primary) hypertension: Secondary | ICD-10-CM | POA: Diagnosis not present

## 2024-04-08 IMAGING — CR DG CHEST 2V
2 series · 2 of 2 positions shown · non-contrast
Comparison: None

CLINICAL DATA: A 50-year-old female presents for evaluation of
cough, concern for pneumonia.

EXAM:
CHEST - 2 VIEW

[chest pa]
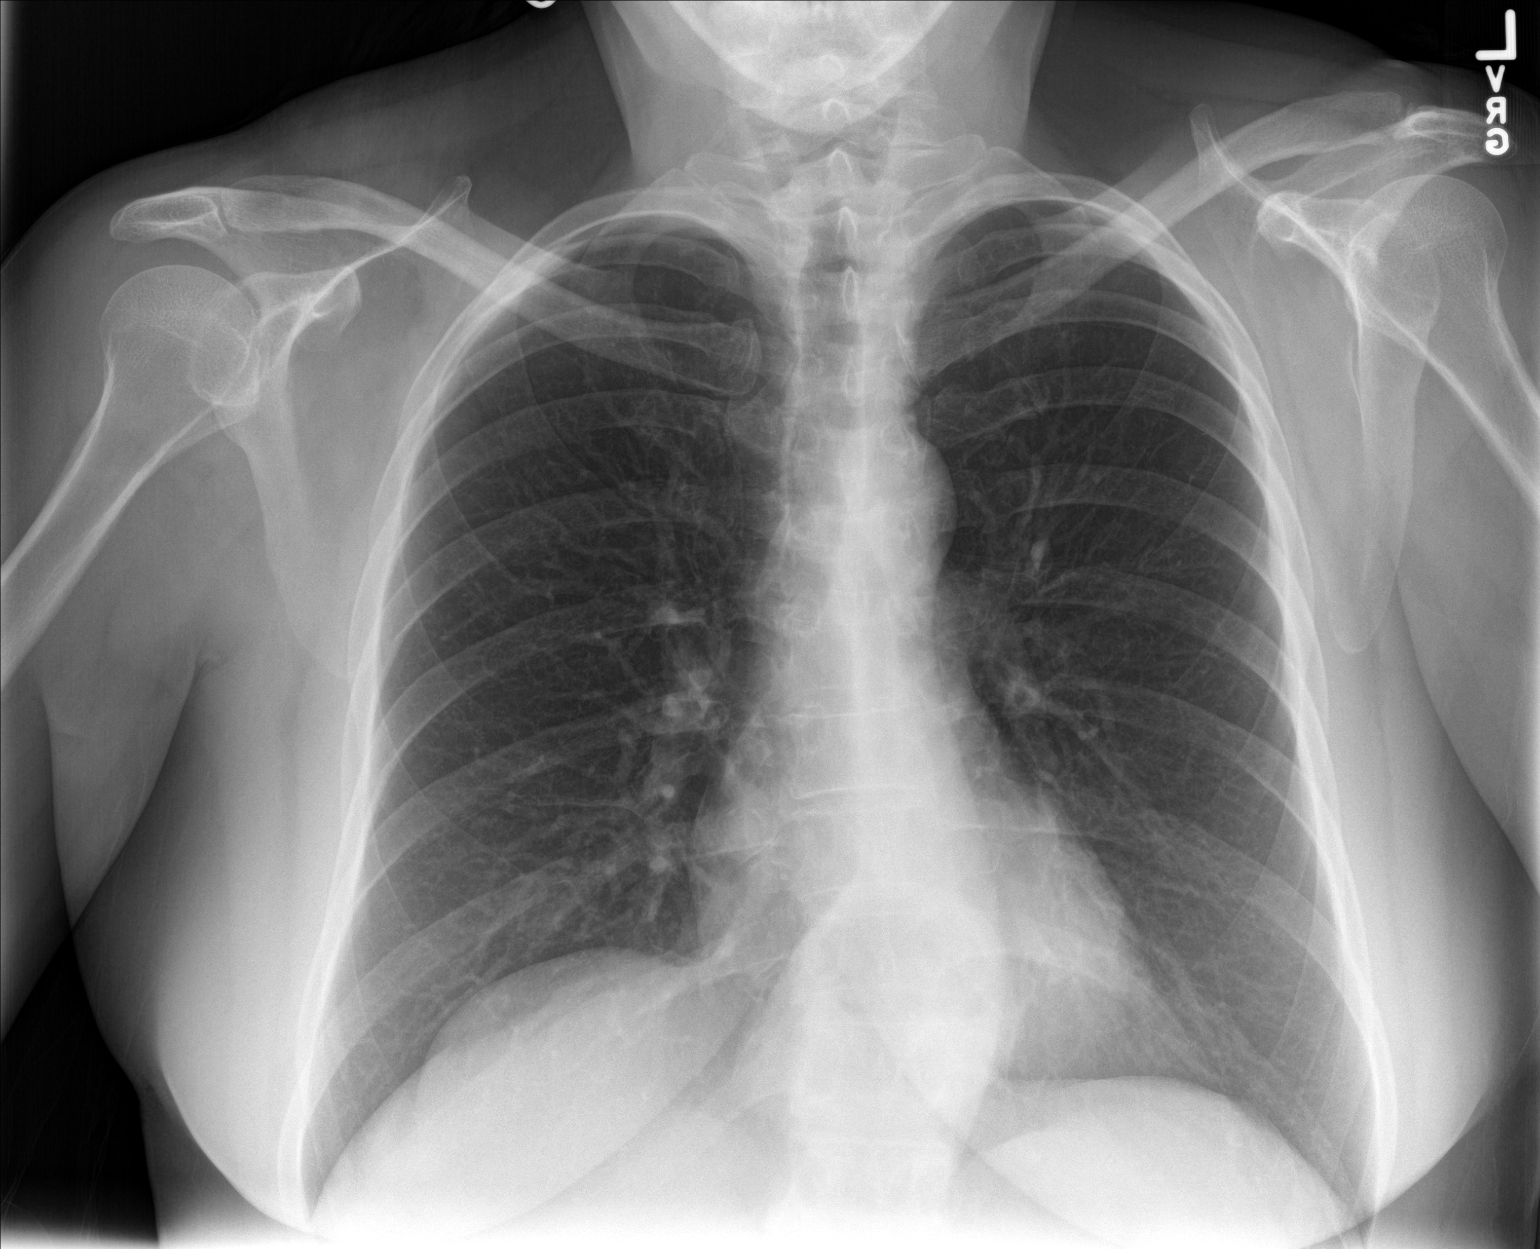

[chest lat]
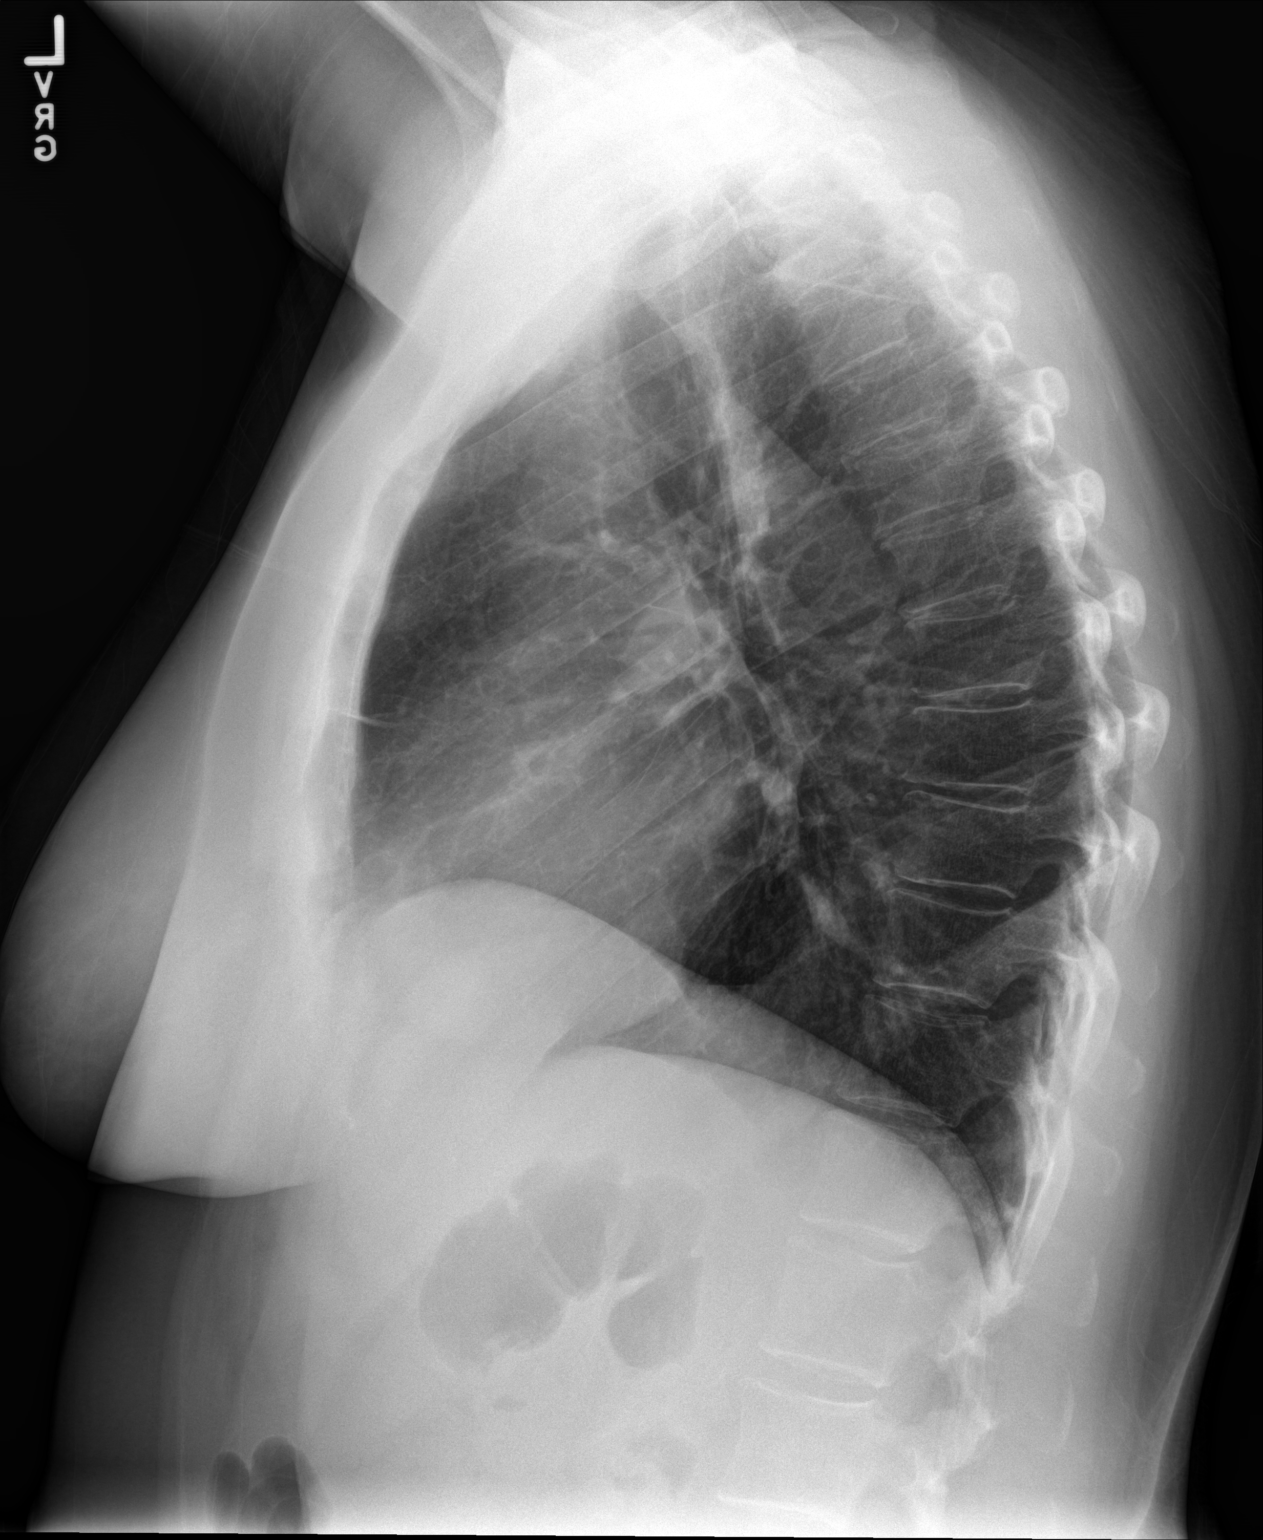

[2 of 2 positions shown; findings below may reference images not displayed]

FINDINGS: Trachea midline.

Cardiomediastinal contours and hilar structures are normal.

No lobar consolidation.  No sign of pleural effusion.

On limited assessment no acute skeletal process. Signs of hiatal
hernia.
IMPRESSION: No acute cardiopulmonary disease.

Small hiatal hernia.

## 2024-04-16 ENCOUNTER — Encounter: Admitting: Internal Medicine

## 2024-05-08 ENCOUNTER — Other Ambulatory Visit

## 2024-05-11 ENCOUNTER — Encounter: Admitting: Internal Medicine

## 2024-05-15 ENCOUNTER — Encounter: Admitting: Internal Medicine

## 2024-05-16 ENCOUNTER — Other Ambulatory Visit
